# Patient Record
Sex: Female | Born: 1955 | Race: White | Hispanic: No | Smoking: Never smoker
Health system: Southern US, Community
[De-identification: ages and names within clinical notes are randomized; demographics above are authoritative.]

## PROBLEM LIST (undated history)

## (undated) DIAGNOSIS — K219 Gastro-esophageal reflux disease without esophagitis: Secondary | ICD-10-CM

## (undated) DIAGNOSIS — E7800 Pure hypercholesterolemia, unspecified: Secondary | ICD-10-CM

## (undated) DIAGNOSIS — M1990 Unspecified osteoarthritis, unspecified site: Secondary | ICD-10-CM

## (undated) DIAGNOSIS — F32A Depression, unspecified: Secondary | ICD-10-CM

## (undated) DIAGNOSIS — E119 Type 2 diabetes mellitus without complications: Secondary | ICD-10-CM

## (undated) DIAGNOSIS — T7840XA Allergy, unspecified, initial encounter: Secondary | ICD-10-CM

## (undated) HISTORY — DX: Unspecified osteoarthritis, unspecified site: M19.90

## (undated) HISTORY — DX: Gastro-esophageal reflux disease without esophagitis: K21.9

## (undated) HISTORY — DX: Depression, unspecified: F32.A

## (undated) HISTORY — PX: TUBAL LIGATION: SHX77

## (undated) HISTORY — PX: APPENDECTOMY: SHX54

## (undated) HISTORY — DX: Allergy, unspecified, initial encounter: T78.40XA

---

## 2021-02-04 LAB — HM COLONOSCOPY

## 2021-03-17 LAB — HM MAMMOGRAPHY

## 2021-09-25 ENCOUNTER — Ambulatory Visit (HOSPITAL_COMMUNITY)
Admission: EM | Admit: 2021-09-25 | Discharge: 2021-09-25 | Disposition: A | Discharge status: Home / Self Care | Payer: No Typology Code available for payment source | Attending: Emergency Medicine | Admitting: Emergency Medicine

## 2021-09-25 ENCOUNTER — Encounter (HOSPITAL_COMMUNITY): Payer: Self-pay

## 2021-09-25 DIAGNOSIS — R059 Cough, unspecified: Secondary | ICD-10-CM | POA: Diagnosis not present

## 2021-09-25 DIAGNOSIS — Z20822 Contact with and (suspected) exposure to covid-19: Secondary | ICD-10-CM | POA: Diagnosis not present

## 2021-09-25 DIAGNOSIS — J069 Acute upper respiratory infection, unspecified: Secondary | ICD-10-CM | POA: Insufficient documentation

## 2021-09-25 HISTORY — DX: Type 2 diabetes mellitus without complications: E11.9

## 2021-09-25 HISTORY — DX: Pure hypercholesterolemia, unspecified: E78.00

## 2021-09-25 MED ORDER — PREDNISONE 20 MG PO TABS: 40.0000 mg | ORAL_TABLET | Freq: Every day | ORAL | 0 refills | Status: DC

## 2021-09-25 MED ORDER — AZITHROMYCIN 250 MG PO TABS: 250.0000 mg | ORAL_TABLET | Freq: Every day | ORAL | 0 refills | Status: DC

## 2021-09-25 NOTE — ED Provider Notes (Addendum)
[place]    CSN: [id number] Arrival date & time: 09/25/21  1136      History   Chief Complaint Chief Complaint  Patient presents with   Sore Throat   Cough   Nasal Congestion    HPI Emily Sims is a 66 y.o. female.   Patient presents with congestion, rhinorrhea, sore throat and a intermittent productive cough for 4 days.  Cough has been constant and begun to interfere with sleep.  Has attempted use of Mucinex DM and Sudafed.  Tolerating food and liquids.  No known sick contacts. Denies shortness of breath or wheezing, fever, chills or body aches, ear pain.  Endorses a history of reoccurring bronchitis at least yearly, diabetes mellitus and hypercholesterolemia.  Home COVID test negative   Past Medical History:  Diagnosis Date   Diabetes mellitus without complication ([place])    Hypercholesteremia     There are no problems to display for this patient.   History reviewed. No pertinent surgical history.  OB History   No obstetric history on file.      Home Medications    Prior to Admission medications   Medication Sig Start Date End Date Taking? Authorizing Provider  atorvastatin (LIPITOR) 20 MG tablet TAKE 1 TABLET (20 MG TOTAL) BY MOUTH DAILY. DEFAULT OP INS 03/13/16  Yes [provider]  empagliflozin (JARDIANCE) 10 MG TABS tablet  04/18/21  Yes [provider]  FLUoxetine (PROZAC) 40 MG capsule TAKE 1 CAPSULE BY MOUTH EVERY DAY FOR 30 DAYS 05/18/21  Yes [provider]  omeprazole (PRILOSEC) 40 MG capsule Take 1 tablet by mouth daily. 05/14/21  Yes [provider]    Family History History reviewed. No pertinent family history.  Social History Social History   Tobacco Use   Smoking status: Never    Passive exposure: Never   Smokeless tobacco: Never  Vaping Use   Vaping Use: Never used  Substance Use Topics   Alcohol use: Never   Drug use: Never     Allergies   Patient has no known  allergies.   Review of Systems Review of Systems  Constitutional: Negative.   HENT:  Positive for congestion, rhinorrhea and sore throat. Negative for dental problem, drooling, ear discharge, ear pain, facial swelling, hearing loss, mouth sores, nosebleeds, postnasal drip, sinus pressure, sinus pain, sneezing, tinnitus, trouble swallowing and voice change.   Respiratory:  Positive for cough. Negative for apnea, choking, chest tightness, shortness of breath, wheezing and stridor.   Cardiovascular: Negative.   Gastrointestinal: Negative.   Skin: Negative.   Neurological: Negative.      Physical Exam Triage Vital Signs ED Triage Vitals [09/25/21 1210]  Enc Vitals Group     BP      Pulse      Resp      Temp      Temp src      SpO2      Weight 185 lb (83.9 kg)     Height '5\' 4"'$  (1.626 m)     Head Circumference      Peak Flow      Pain Score 0     Pain Loc      Pain Edu?      Excl. in [place]?    No data found.  Updated Vital Signs Ht '5\' 4"'$  (1.626 m)   Wt 185 lb (83.9 kg)   LMP  (LMP Unknown)   BMI 31.76 kg/m   Visual Acuity Right Eye Distance:  Left Eye Distance:   Bilateral Distance:    Right Eye Near:   Left Eye Near:    Bilateral Near:     Physical Exam Constitutional:      Appearance: Normal appearance. She is well-developed.  HENT:     Head: Normocephalic.     Right Ear: Tympanic membrane and ear canal normal.     Left Ear: Tympanic membrane and ear canal normal.     Nose: Congestion and rhinorrhea present.     Mouth/Throat:     Mouth: Mucous membranes are moist.     Pharynx: Oropharynx is clear.     Tonsils: No tonsillar exudate. 0 on the right. 0 on the left.  Cardiovascular:     Rate and Rhythm: Normal rate and regular rhythm.     Heart sounds: Normal heart sounds.  Pulmonary:     Effort: Pulmonary effort is normal.     Breath sounds: Normal breath sounds.  Musculoskeletal:     Cervical back: Normal range of motion and neck supple.  Skin:     General: Skin is warm and dry.  Neurological:     General: No focal deficit present.     Mental Status: She is alert and oriented to person, place, and time.  Psychiatric:        Mood and Affect: Mood normal.        Behavior: Behavior normal.      UC Treatments / Results  Labs (all labs ordered are listed, but only abnormal results are displayed) Labs Reviewed - No data to display  EKG   Radiology No results found.  Procedures Procedures (including critical care time)  Medications Ordered in UC Medications - No data to display  Initial Impression / Assessment and Plan / UC Course  I have reviewed the triage vital signs and the nursing notes.  Pertinent labs & imaging results that were available during my care of the patient were reviewed by me and considered in my medical decision making (see chart for details).  Viral URI with cough  Vitals are stable patient is in no signs of distress, lungs are clear to auscultation and O2 saturation via machine on room is greater than 90 without assistance, low suspicion for bronchitis, pneumonia or pneumothorax and therefore will defer imaging, retesting for COVID completed, will qualify for antivirals if positive, discussed use and administration as well as quarantine if positive , etiology is most likely viral, discussed with patient, prescribed prednisone, offered albuterol inhaler and cough medicine however patient endorses that Tessalon is ineffective and she does not tolerate inhalers or liquid cough medicine well, recommended over-the-counter medication she can take for additional support, watchful wait antibiotic placed at pharmacy for day 10 of illness if symptoms have not begun to improve, may follow-up with urgent care as needed Final Clinical Impressions(s) / UC Diagnoses   Final diagnoses:  None   Discharge Instructions   None    ED Prescriptions   None    PDMP not reviewed this encounter.   Hans Eden,  NP 09/25/21 1251    Hans Eden, NP 09/25/21 1252

## 2021-09-25 NOTE — Discharge Instructions (Signed)
We will contact you if your COVID test is positive.  Please quarantine while you wait for the results.  If your test is negative you may resume normal activities.  If your test is positive please continue to quarantine for at least 5 days from your symptom onset , your quarantine will and on Tuesday, if symptoms are still present after 5 days please wear masks while completing activities.  If positive you will qualify for antiviral medicine which will help minimize your symptoms, take every morning and every evening for 5 days, medicine will be sent in at time of notification  Take prednisone every morning with food for 5 days to reduce inflammation and irritation to the upper airways which ideally will help minimize your coughing, at this time your lungs are clear and you are getting enough air without assistance and I do not believe that you have bronchitis  If your symptoms persist to 10 days without signs of resolution a Z-Pak will be at the pharmacy waiting for you on Friday    You can take Tylenol and/or Ibuprofen as needed for fever reduction and pain relief.   For cough: honey 1/2 to 1 teaspoon (you can dilute the honey in water or another fluid).  You can also use guaifenesin and dextromethorphan for cough. You can use a humidifier for chest congestion and cough.  If you don't have a humidifier, you can sit in the bathroom with the hot shower running.      For sore throat: try warm salt water gargles, cepacol lozenges, throat spray, warm tea or water with lemon/honey, popsicles or ice, or OTC cold relief medicine for throat discomfort.   For congestion: take a daily anti-histamine like Zyrtec, Claritin, and a oral decongestant, such as pseudoephedrine.  You can also use Flonase 1-2 sprays in each nostril daily.   It is important to stay hydrated: drink plenty of fluids (water, gatorade/powerade/pedialyte, juices, or teas) to keep your throat moisturized and help further relieve  irritation/discomfort.

## 2021-09-25 NOTE — ED Triage Notes (Signed)
Pt is here for sore throat, cough  runny nose , pain across middle  of back , pt states cough is keeping her up at night  x4days . Pt denies pain when swallowing. Pt states she took a home covid test and it was negative.

## 2021-09-26 LAB — SARS CORONAVIRUS 2 (TAT 6-24 HRS): SARS Coronavirus 2: NEGATIVE

## 2021-09-29 ENCOUNTER — Ambulatory Visit (INDEPENDENT_AMBULATORY_CARE_PROVIDER_SITE_OTHER): Payer: PRIVATE HEALTH INSURANCE | Admitting: Family

## 2021-09-29 ENCOUNTER — Encounter: Payer: Self-pay | Admitting: Family

## 2021-09-29 VITALS — BP 98/62 | HR 72 | Temp 97.4°F | Ht 64.0 in | Wt 191.4 lb

## 2021-09-29 DIAGNOSIS — E1122 Type 2 diabetes mellitus with diabetic chronic kidney disease: Secondary | ICD-10-CM

## 2021-09-29 DIAGNOSIS — E1169 Type 2 diabetes mellitus with other specified complication: Secondary | ICD-10-CM | POA: Insufficient documentation

## 2021-09-29 DIAGNOSIS — E1165 Type 2 diabetes mellitus with hyperglycemia: Secondary | ICD-10-CM | POA: Insufficient documentation

## 2021-09-29 DIAGNOSIS — K219 Gastro-esophageal reflux disease without esophagitis: Secondary | ICD-10-CM | POA: Insufficient documentation

## 2021-09-29 DIAGNOSIS — F331 Major depressive disorder, recurrent, moderate: Secondary | ICD-10-CM | POA: Diagnosis not present

## 2021-09-29 MED ORDER — GLIPIZIDE ER 2.5 MG PO TB24: 2.5000 mg | ORAL_TABLET | Freq: Every day | ORAL | 2 refills | Status: DC

## 2021-09-29 MED ORDER — FLUOXETINE HCL 40 MG PO CAPS: 40.0000 mg | ORAL_CAPSULE | Freq: Every day | ORAL | 1 refills | Status: DC

## 2021-09-29 NOTE — Assessment & Plan Note (Signed)
   chronic  taking Lipitor qd  no refill needed today, last labs 05/2021  f/u in 3 mos fasting

## 2021-09-29 NOTE — Assessment & Plan Note (Addendum)
   chronic  reports intolerance with Metformin, only taking Farxiga '10mg'$  qd, but is still expensive, she is working w/her ins to see affordable choices, advised to check w/pharmaceutical co. directly re: pt assistance  no refill needed today  f/u in 3 mos w/labs

## 2021-09-29 NOTE — Assessment & Plan Note (Signed)
   chronic  stable with Prozac '40mg'$  qd  sending refill today  f/u in 6 mos or prn

## 2021-09-29 NOTE — Patient Instructions (Signed)
Welcome to [facility], It was a pleasure meeting you today!    As discussed, I have sent your refills to your pharmacy.   Please schedule a 3 month follow up visit today or online via MyChart.    PLEASE NOTE: If you had any LAB tests please let us know if you have not heard back within a few days. You may see your results on MyChart before we have a chance to review them but we will give you a call once they are reviewed by [place]. If we ordered any REFERRALS today, please let us know if you have not heard from their office within the next week.  Let us know through MyChart if you are needing REFILLS, or have your pharmacy send [place] the request. You can also use MyChart to communicate with me or any office staff.   Please try these tips to maintain a healthy lifestyle:  Eat most of your calories during the day when you are active. Eliminate processed foods including packaged sweets (pies, cakes, cookies), reduce intake of potatoes, white bread, white pasta, and white rice. Look for whole grain options, oat flour or almond flour.  Each meal should contain half fruits/vegetables, one quarter protein, and one quarter carbs (no bigger than a computer mouse).  Cut down on sweet beverages. This includes juice, soda, and sweet tea. Also watch fruit intake, though this is a healthier sweet option, it still contains natural sugar! Limit to 3 servings daily.  Drink at least 1 8oz. glass of water with each meal and aim for at least 8 glasses per day  Exercise at least 150 minutes every week.

## 2021-09-29 NOTE — Progress Notes (Signed)
New Patient Office Visit  Subjective:  Patient ID: Emily Sims, female    DOB: Apr 07, 1955  Age: 66 y.o. MRN: [id number]  CC:  Chief Complaint  Patient presents with   New Patient (Initial Visit)    HPI Emily Sims presents for establishing care today.  Depression:  pt presents with several year history of depression. She has been taking Prozac for long time with good results, denies any SE. Requesting refill today. T2DM: Pt is currently maintained on the following medications for diabetes: Farxiga Failed meds include: Metformin; GLP-1, never tried, too expensive, no other meds. Denies polyuria/polydipsia/polyphagia Denies hypoglycemia Home glucose readings range:  fasting 140s; does not check daily Last A1C was 7.1 in 05/2021.  Assessment & Plan:   Problem List Items Addressed This Visit       Endocrine   Type 2 diabetes mellitus with chronic kidney disease, without long-term current use of insulin ([place]) - Primary    chronic reports intolerance with Metformin, only taking Farxiga '10mg'$  qd, but is still expensive, she is working w/her ins to see affordable choices, advised to check w/pharmaceutical co. directly re: pt assistance no refill needed today f/u in 3 mos w/labs      Relevant Medications   glipiZIDE (GLUCOTROL XL) 2.5 MG 24 hr tablet     Other   Moderate recurrent major depression ([place])    chronic stable with Prozac '40mg'$  qd sending refill today f/u in 6 mos or prn      Relevant Medications   FLUoxetine (PROZAC) 40 MG capsule    Subjective:    Outpatient Medications Prior to Visit  Medication Sig Dispense Refill   atorvastatin (LIPITOR) 20 MG tablet TAKE 1 TABLET (20 MG TOTAL) BY MOUTH DAILY. DEFAULT OP INS     [START ON 09/30/2021] azithromycin (ZITHROMAX) 250 MG tablet Take 1 tablet (250 mg total) by mouth daily. Take first 2 tablets together, then 1 every day until finished. 6 tablet 0   empagliflozin (JARDIANCE) 10 MG TABS tablet       omeprazole (PRILOSEC) 40 MG capsule Take 1 tablet by mouth daily.     predniSONE (DELTASONE) 20 MG tablet Take 2 tablets (40 mg total) by mouth daily. 10 tablet 0   FLUoxetine (PROZAC) 40 MG capsule TAKE 1 CAPSULE BY MOUTH EVERY DAY FOR 30 DAYS     No facility-administered medications prior to visit.   Past Medical History:  Diagnosis Date   Allergy    Arthritis    Depression    Diabetes mellitus without complication (H[facility])    GERD (gastroesophageal reflux disease)    Hypercholesteremia    History reviewed. No pertinent surgical history.  Objective:   Today's Vitals: BP 98/62   Pulse 72   Temp (!) 97.4 F (36.3 C)   Ht '5\' 4"'$  (1.626 m)   Wt 191 lb 6.4 oz (86.8 kg)   LMP  (LMP Unknown)   SpO2 96%   BMI 32.85 kg/m   Physical Exam Vitals and nursing note reviewed.  Constitutional:      Appearance: Normal appearance. She is obese.  Cardiovascular:     Rate and Rhythm: Normal rate and regular rhythm.  Pulmonary:     Effort: Pulmonary effort is normal.     Breath sounds: Normal breath sounds.  Musculoskeletal:        General: Normal range of motion.  Skin:    General: Skin is warm and dry.  Neurological:     Mental Status: She  is alert.  Psychiatric:        Mood and Affect: Mood normal.        Behavior: Behavior normal.     Meds ordered this encounter  Medications   FLUoxetine (PROZAC) 40 MG capsule    Sig: Take 1 capsule (40 mg total) by mouth daily.    Dispense:  90 capsule    Refill:  1    Order Specific Question:   Supervising Provider    Answer:   ANDY, CAMILLE L [[id number]]   glipiZIDE (GLUCOTROL XL) 2.5 MG 24 hr tablet    Sig: Take 1 tablet (2.5 mg total) by mouth daily with breakfast.    Dispense:  30 tablet    Refill:  2    Order Specific Question:   Supervising Provider    Answer:   ANDY, CAMILLE L [[id number]]    Jeanie Sewer, NP

## 2021-11-08 ENCOUNTER — Other Ambulatory Visit: Payer: Self-pay

## 2021-11-08 ENCOUNTER — Telehealth: Payer: Self-pay | Admitting: Family

## 2021-11-08 MED ORDER — ATORVASTATIN CALCIUM 20 MG PO TABS: ORAL_TABLET | ORAL | 0 refills | Status: DC

## 2021-11-08 MED ORDER — EMPAGLIFLOZIN 10 MG PO TABS: 10.0000 mg | ORAL_TABLET | Freq: Every day | ORAL | 0 refills | Status: DC

## 2021-11-08 NOTE — Telephone Encounter (Signed)
RX sent

## 2021-11-08 NOTE — Telephone Encounter (Signed)
Patient states: -She just established care with [facility] on 09/29/21 but forgot that meds she needs refilled were through her former pcp   LAST APPOINTMENT DATE:  09/29/21  NEXT APPOINTMENT DATE: 12/27/21  MEDICATION: atorvastatin (LIPITOR) 20 MG tablet  empagliflozin (JARDIANCE) 10 MG TABS tablet   Is the patient out of medication? Will be out the week of 10/23  PHARMACY: [facility] [address], [place],  [address]

## 2021-12-13 DIAGNOSIS — M5432 Sciatica, left side: Secondary | ICD-10-CM | POA: Diagnosis not present

## 2021-12-13 DIAGNOSIS — M9903 Segmental and somatic dysfunction of lumbar region: Secondary | ICD-10-CM | POA: Diagnosis not present

## 2021-12-14 ENCOUNTER — Telehealth: Payer: Self-pay | Admitting: Family

## 2021-12-14 ENCOUNTER — Other Ambulatory Visit: Payer: Self-pay

## 2021-12-14 DIAGNOSIS — M5432 Sciatica, left side: Secondary | ICD-10-CM | POA: Diagnosis not present

## 2021-12-14 DIAGNOSIS — M9903 Segmental and somatic dysfunction of lumbar region: Secondary | ICD-10-CM | POA: Diagnosis not present

## 2021-12-14 MED ORDER — OMEPRAZOLE 40 MG PO CPDR: 40.0000 mg | DELAYED_RELEASE_CAPSULE | Freq: Every day | ORAL | 0 refills | Status: DC

## 2021-12-14 NOTE — Telephone Encounter (Signed)
RX Sent. 

## 2021-12-14 NOTE — Telephone Encounter (Signed)
  LAST APPOINTMENT DATE: 09/29/21  NEXT APPOINTMENT DATE: 12/27/21  MEDICATION:  omeprazole (PRILOSEC) 40 MG capsule   Is the patient out of medication? Will be out in 2-3 days  PHARMACY: [id number] [place], [place] - [address] [place]  [id number]: [phone] Fax: [phone]   Patient states: -Medication was last filled [facility]

## 2021-12-20 DIAGNOSIS — M9903 Segmental and somatic dysfunction of lumbar region: Secondary | ICD-10-CM | POA: Diagnosis not present

## 2021-12-20 DIAGNOSIS — M5432 Sciatica, left side: Secondary | ICD-10-CM | POA: Diagnosis not present

## 2021-12-21 ENCOUNTER — Other Ambulatory Visit: Payer: Self-pay | Admitting: Family

## 2021-12-21 DIAGNOSIS — E1122 Type 2 diabetes mellitus with diabetic chronic kidney disease: Secondary | ICD-10-CM

## 2021-12-22 DIAGNOSIS — M9903 Segmental and somatic dysfunction of lumbar region: Secondary | ICD-10-CM | POA: Diagnosis not present

## 2021-12-22 DIAGNOSIS — M5432 Sciatica, left side: Secondary | ICD-10-CM | POA: Diagnosis not present

## 2021-12-26 DIAGNOSIS — M5432 Sciatica, left side: Secondary | ICD-10-CM | POA: Diagnosis not present

## 2021-12-26 DIAGNOSIS — M9903 Segmental and somatic dysfunction of lumbar region: Secondary | ICD-10-CM | POA: Diagnosis not present

## 2021-12-27 ENCOUNTER — Encounter: Payer: Self-pay | Admitting: Family

## 2021-12-27 ENCOUNTER — Ambulatory Visit (INDEPENDENT_AMBULATORY_CARE_PROVIDER_SITE_OTHER): Payer: No Typology Code available for payment source | Admitting: Family

## 2021-12-27 VITALS — BP 123/81 | HR 66 | Temp 96.9°F | Ht 64.0 in | Wt 194.4 lb

## 2021-12-27 DIAGNOSIS — E1122 Type 2 diabetes mellitus with diabetic chronic kidney disease: Secondary | ICD-10-CM

## 2021-12-27 DIAGNOSIS — E1169 Type 2 diabetes mellitus with other specified complication: Secondary | ICD-10-CM | POA: Diagnosis not present

## 2021-12-27 DIAGNOSIS — E785 Hyperlipidemia, unspecified: Secondary | ICD-10-CM

## 2021-12-27 DIAGNOSIS — D229 Melanocytic nevi, unspecified: Secondary | ICD-10-CM

## 2021-12-27 LAB — COMPREHENSIVE METABOLIC PANEL
ALT: 16 U/L (ref 0–35)
AST: 12 U/L (ref 0–37)
Albumin: 4.2 g/dL (ref 3.5–5.2)
Alkaline Phosphatase: 81 U/L (ref 39–117)
BUN: 20 mg/dL (ref 6–23)
CO2: 29 mEq/L (ref 19–32)
Calcium: 9.1 mg/dL (ref 8.4–10.5)
Chloride: 102 mEq/L (ref 96–112)
Creatinine, Ser: 0.79 mg/dL (ref 0.40–1.20)
GFR: 77.53 mL/min (ref >60.00)
Glucose, Bld: 123 mg/dL — ABNORMAL HIGH (ref 70–99)
Potassium: 4.1 mEq/L (ref 3.5–5.1)
Sodium: 139 mEq/L (ref 135–145)
Total Bilirubin: 0.7 mg/dL (ref 0.2–1.2)
Total Protein: 6.8 g/dL (ref 6.0–8.3)

## 2021-12-27 LAB — LIPID PANEL
Cholesterol: 165 mg/dL (ref 0–200)
HDL: 56.2 mg/dL (ref >39.00)
NonHDL: 108.6
Total CHOL/HDL Ratio: 3
Triglycerides: 213 mg/dL — ABNORMAL HIGH (ref 0.0–149.0)
VLDL: 42.6 mg/dL — ABNORMAL HIGH (ref 0.0–40.0)

## 2021-12-27 LAB — LDL CHOLESTEROL, DIRECT: Direct LDL: 78 mg/dL

## 2021-12-27 LAB — HEMOGLOBIN A1C: Hgb A1c MFr Bld: 7.4 % — ABNORMAL HIGH (ref 4.6–6.5)

## 2021-12-27 MED ORDER — EMPAGLIFLOZIN 25 MG PO TABS: 25.0000 mg | ORAL_TABLET | Freq: Every day | ORAL | 1 refills | Status: DC

## 2021-12-27 NOTE — Patient Instructions (Signed)
It was very nice to see you today!   I will review your lab results via MyChart in a few days.   I have sent refills to your pharmacy and a referral to Dermatology.     PLEASE NOTE:  If you had any lab tests please let us know if you have not heard back within a few days. You may see your results on MyChart before we have a chance to review them but we will give you a call once they are reviewed by [place]. If we ordered any referrals today, please let us know if you have not heard from their office within the next week.

## 2021-12-27 NOTE — Assessment & Plan Note (Addendum)
chronic taking Farxiga '10mg'$  qd & Glipizide 2.'5mg'$  qd (this started last visit) checking labs today sending refill of [facility] today f/u in 6 mos

## 2021-12-27 NOTE — Assessment & Plan Note (Addendum)
chronic taking Lipitor '20mg'$  qd no refill needed today, checking labs f/u in 6 mos

## 2021-12-27 NOTE — Progress Notes (Signed)
Patient ID: Emily Sims, female    DOB: February 15, 1955, 66 y.o.   MRN: [id number]  Chief Complaint  Patient presents with   Diabetes    Fasting w/ Labs   Hyperlipidemia    HPI: T2DM: Pt is currently maintained on the following medications for diabetes: Farxiga, Glipizide Failed meds include: Metformin; GLP-1, never tried, too expensive, no other meds. Denies polyuria/polydipsia/polyphagia Denies hypoglycemia Home glucose readings range:  does not check daily Last A1C was 7.1 in 05/2021.  Rechecking today.  Assessment & Plan:   Problem List Items Addressed This Visit       Endocrine   Type 2 diabetes mellitus with chronic kidney disease, without long-term current use of insulin ([place]) - Primary    chronic taking Farxiga 46m qd & Glipizide 2.573mqd (this started last visit) checking labs today sending refill of Farxiga today f/u in 6 mos       Relevant Medications   empagliflozin (JARDIANCE) 25 MG TABS tablet   Other Relevant Orders   HM DIABETES FOOT EXAM (Completed)   Comp Met ([facility])   HgB A1c   Hyperlipidemia associated with type 2 diabetes mellitus (H[facility])    chronic taking Lipitor 2033md no refill needed today, checking labs f/u in 6 mos       Relevant Medications   empagliflozin (JARDIANCE) 25 MG TABS tablet   Other Relevant Orders   Lipid panel   Other Visit Diagnoses     Atypical mole       Relevant Orders   Ambulatory referral to Dermatology       Subjective:    Outpatient Medications Prior to Visit  Medication Sig Dispense Refill   atorvastatin (LIPITOR) 20 MG tablet TAKE 1 TABLET (20 MG TOTAL) BY MOUTH DAILY. DEFAULT OP INS 90 tablet 0   FLUoxetine (PROZAC) 40 MG capsule Take 1 capsule (40 mg total) by mouth daily. 90 capsule 1   glipiZIDE (GLUCOTROL XL) 2.5 MG 24 hr tablet TAKE 1 TABLET BY MOUTH DAILY WITH BREAKFAST. 90 tablet 0   omeprazole (PRILOSEC) 40 MG capsule Take 1 capsule (40 mg total) by mouth daily. 90 capsule 0   empagliflozin  (JARDIANCE) 10 MG TABS tablet Take 1 tablet (10 mg total) by mouth daily. (Patient taking differently: Take 25 mg by mouth daily.) 90 tablet 0   predniSONE (DELTASONE) 20 MG tablet Take 2 tablets (40 mg total) by mouth daily. 10 tablet 0   azithromycin (ZITHROMAX) 250 MG tablet Take 1 tablet (250 mg total) by mouth daily. Take first 2 tablets together, then 1 every day until finished. (Patient not taking: Reported on 12/27/2021) 6 tablet 0   No facility-administered medications prior to visit.   Past Medical History:  Diagnosis Date   Allergy    Arthritis    Depression    Diabetes mellitus without complication (HCC)    GERD (gastroesophageal reflux disease)    Hypercholesteremia    History reviewed. No pertinent surgical history. No Known Allergies    Objective:    Physical Exam Vitals and nursing note reviewed.  Constitutional:      Appearance: Normal appearance.  HENT:     Head:   Cardiovascular:     Rate and Rhythm: Normal rate and regular rhythm.     Pulses:          Dorsalis pedis pulses are 2+ on the right side and 2+ on the left side.       Posterior tibial pulses are 2+ on  the right side and 2+ on the left side.  Pulmonary:     Effort: Pulmonary effort is normal.     Breath sounds: Normal breath sounds.  Musculoskeletal:        General: Normal range of motion.  Feet:     Right foot:     Protective Sensation: 10 sites tested.  9 sites sensed.     Skin integrity: Callus (mild on heel) present.     Left foot:     Protective Sensation: 10 sites tested.  9 sites sensed.     Skin integrity: Callus (mild on heel) present.  Skin:    General: Skin is warm and dry.     Findings: Lesion (0.3cm diameter, slighly raised, tan on left side of face, & pinpoint raised, skin colored bump under left eye, see diagram above) present.  Neurological:     Mental Status: She is alert.  Psychiatric:        Mood and Affect: Mood normal.        Behavior: Behavior normal.    BP 123/81  (BP Location: Left Arm, Patient Position: Sitting, Cuff Size: Large)   Pulse 66   Temp (!) 96.9 F (36.1 C) (Temporal)   Ht _0  (1.626 m)   Wt 194 lb 6 oz (88.2 kg)   LMP  (LMP Unknown)   SpO2 97%   BMI 33.36 kg/m  Wt Readings from Last 3 Encounters:  12/27/21 194 lb 6 oz (88.2 kg)  09/29/21 191 lb 6.4 oz (86.8 kg)  09/25/21 185 lb (83.9 kg)      Jeanie Sewer, NP

## 2021-12-29 DIAGNOSIS — M5432 Sciatica, left side: Secondary | ICD-10-CM | POA: Diagnosis not present

## 2021-12-29 DIAGNOSIS — M9903 Segmental and somatic dysfunction of lumbar region: Secondary | ICD-10-CM | POA: Diagnosis not present

## 2021-12-29 MED ORDER — GLIPIZIDE ER 2.5 MG PO TB24: 2.5000 mg | ORAL_TABLET | Freq: Two times a day (BID) | ORAL | 1 refills | Status: DC

## 2021-12-29 NOTE — Progress Notes (Signed)
Your glucose is high at 123, electrolytes, liver & kidney function are all normal. Your A1C is up to 7.4. Remember your goal is to get closer to 6!  Increase your Glipizide to twice a day. I would like you to start checking your blood sugar at least in the am, fasting. Let me know if I need to send a glucometer to your pharmacy.  Your cholesterol numbers look good, just your triglycerides & bad cholesterol # (VLDL)  are a little high. Try to reduce any fried foods, alcohol, nonnutritional snacks e.g. chips/cookies,pies, cakes and candies, etc. Always try to increase vegetables & fiber in your diet. Continue or restart an exercise routine, shooting for 45mn 5-7days per week.   Let me know about the glucometer & if you have any questions!

## 2021-12-29 NOTE — Addendum Note (Signed)
Addended byJeanie Sewer on: 12/29/2021 07:20 AM   Modules accepted: Orders

## 2022-01-03 DIAGNOSIS — M9903 Segmental and somatic dysfunction of lumbar region: Secondary | ICD-10-CM | POA: Diagnosis not present

## 2022-01-03 DIAGNOSIS — M5432 Sciatica, left side: Secondary | ICD-10-CM | POA: Diagnosis not present

## 2022-01-05 DIAGNOSIS — M9903 Segmental and somatic dysfunction of lumbar region: Secondary | ICD-10-CM | POA: Diagnosis not present

## 2022-01-05 DIAGNOSIS — M5432 Sciatica, left side: Secondary | ICD-10-CM | POA: Diagnosis not present

## 2022-01-09 DIAGNOSIS — M5432 Sciatica, left side: Secondary | ICD-10-CM | POA: Diagnosis not present

## 2022-01-09 DIAGNOSIS — M9903 Segmental and somatic dysfunction of lumbar region: Secondary | ICD-10-CM | POA: Diagnosis not present

## 2022-01-30 DIAGNOSIS — M5432 Sciatica, left side: Secondary | ICD-10-CM | POA: Diagnosis not present

## 2022-01-30 DIAGNOSIS — M9903 Segmental and somatic dysfunction of lumbar region: Secondary | ICD-10-CM | POA: Diagnosis not present

## 2022-02-07 ENCOUNTER — Other Ambulatory Visit: Payer: Self-pay | Admitting: Family

## 2022-02-15 ENCOUNTER — Telehealth: Payer: Self-pay | Admitting: Family

## 2022-02-15 NOTE — Telephone Encounter (Signed)
..  Type of form received:patient assistant form   Additional comments:   Received by: patient drop off   Form should be Faxed to: [phone]  Form should be mailed to:  na  Is patient requesting call for pickup: no    Form placed:   Myrtie Cruise fodler  Attach charge sheet.  Yes  Individual made aware of 3-5 business day turn around (Y/N)?  Yes

## 2022-02-20 DIAGNOSIS — M5432 Sciatica, left side: Secondary | ICD-10-CM | POA: Diagnosis not present

## 2022-02-20 DIAGNOSIS — M9903 Segmental and somatic dysfunction of lumbar region: Secondary | ICD-10-CM | POA: Diagnosis not present

## 2022-03-08 DIAGNOSIS — M9903 Segmental and somatic dysfunction of lumbar region: Secondary | ICD-10-CM | POA: Diagnosis not present

## 2022-03-08 DIAGNOSIS — M5432 Sciatica, left side: Secondary | ICD-10-CM | POA: Diagnosis not present

## 2022-03-13 ENCOUNTER — Other Ambulatory Visit: Payer: Self-pay | Admitting: Family

## 2022-03-14 ENCOUNTER — Other Ambulatory Visit: Payer: Self-pay | Admitting: Family

## 2022-03-14 DIAGNOSIS — E1122 Type 2 diabetes mellitus with diabetic chronic kidney disease: Secondary | ICD-10-CM

## 2022-03-23 DIAGNOSIS — M9903 Segmental and somatic dysfunction of lumbar region: Secondary | ICD-10-CM | POA: Diagnosis not present

## 2022-03-23 DIAGNOSIS — M5432 Sciatica, left side: Secondary | ICD-10-CM | POA: Diagnosis not present

## 2022-03-24 ENCOUNTER — Other Ambulatory Visit: Payer: Self-pay | Admitting: Family

## 2022-03-24 DIAGNOSIS — F331 Major depressive disorder, recurrent, moderate: Secondary | ICD-10-CM

## 2022-04-06 DIAGNOSIS — M5432 Sciatica, left side: Secondary | ICD-10-CM | POA: Diagnosis not present

## 2022-04-06 DIAGNOSIS — M9903 Segmental and somatic dysfunction of lumbar region: Secondary | ICD-10-CM | POA: Diagnosis not present

## 2022-04-11 NOTE — Telephone Encounter (Signed)
Form faxed on 01/30 with confirmation- form re faxed today 3/19- copy given to patient.

## 2022-04-11 NOTE — Telephone Encounter (Signed)
Patient bringing in new form for Patient Assistance

## 2022-04-28 IMAGING — CR [HOSPITAL] ABD KUB ONLY
1 series · 2 of 2 positions shown · non-contrast
Comparison: None.

HISTORY/INDICATIONS: Vomiting without nausea.
TECHNIQUE: KUB, 2 images.

[Series 1: supine kub · 0.17mm/px · 2 of 2 slices shown]
[im 1/2]
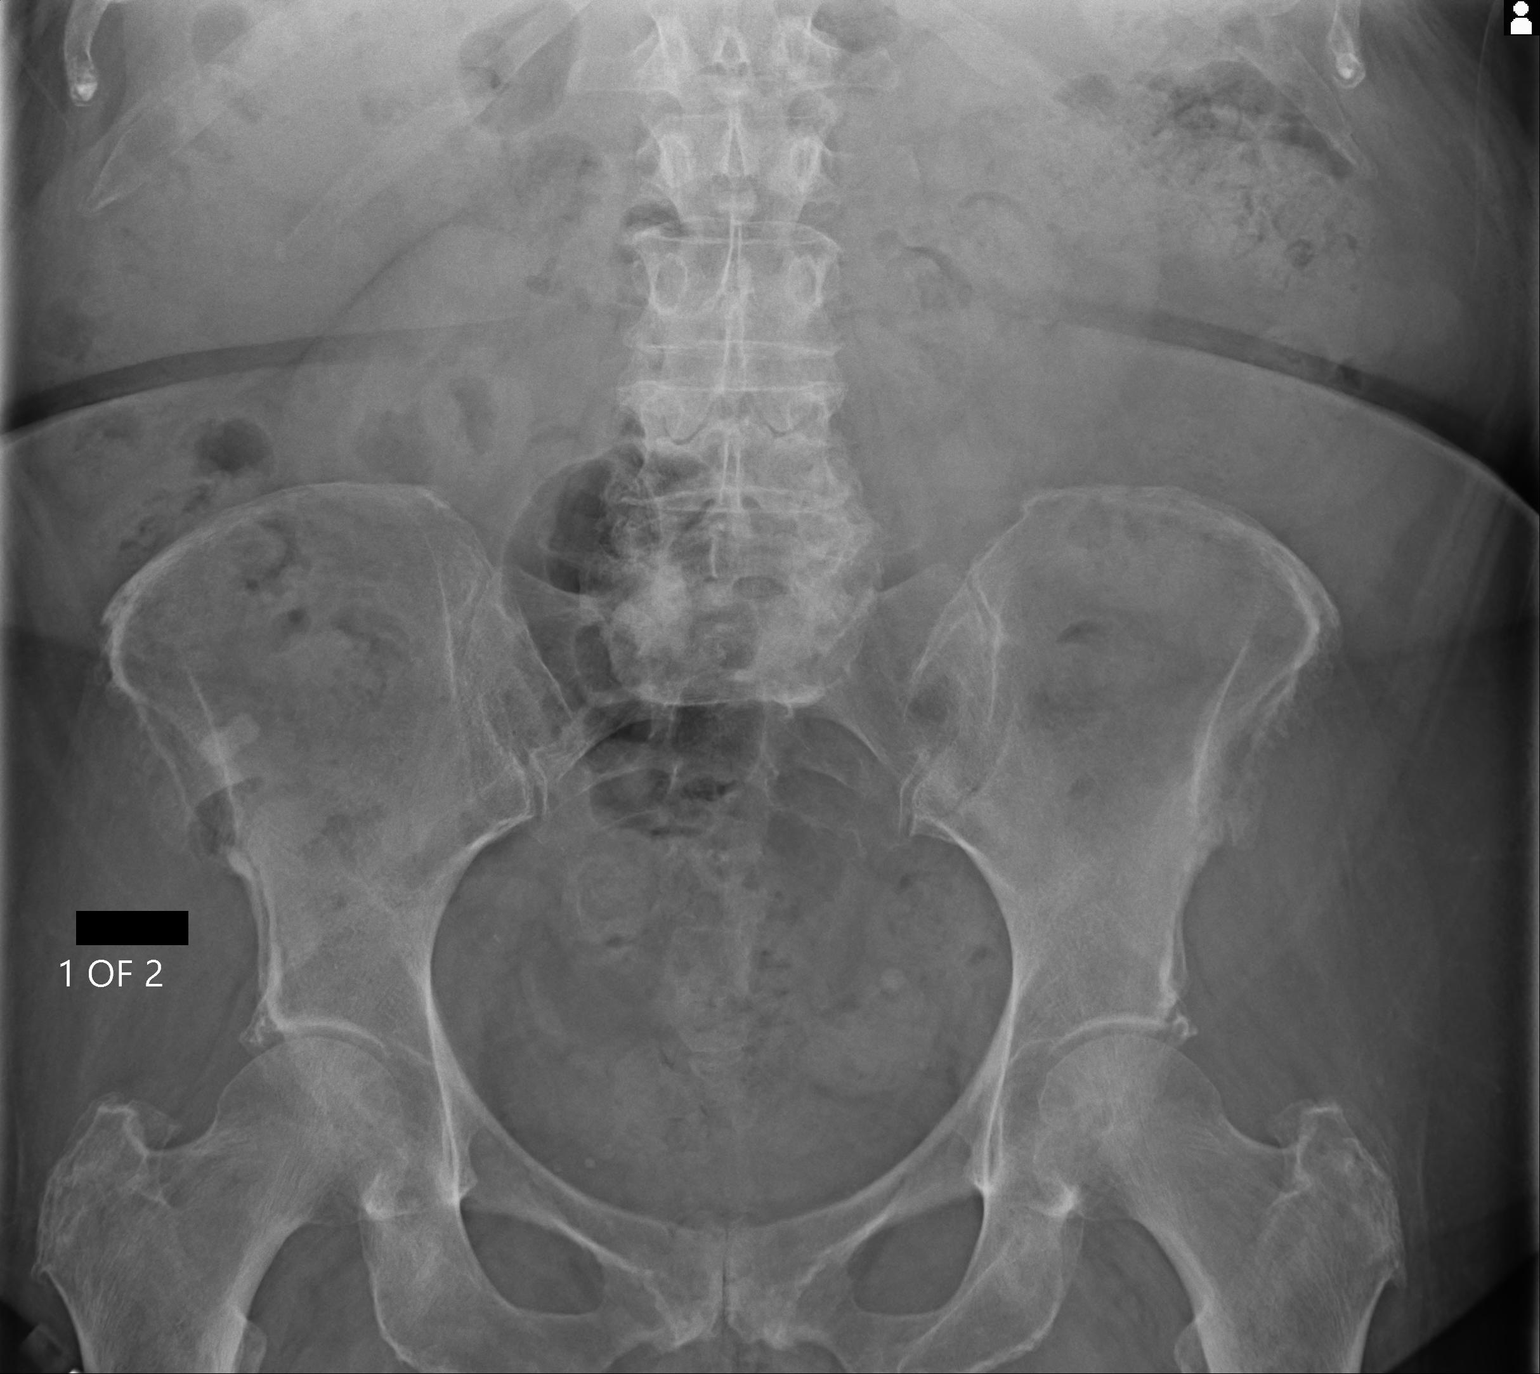
[im 2/2]
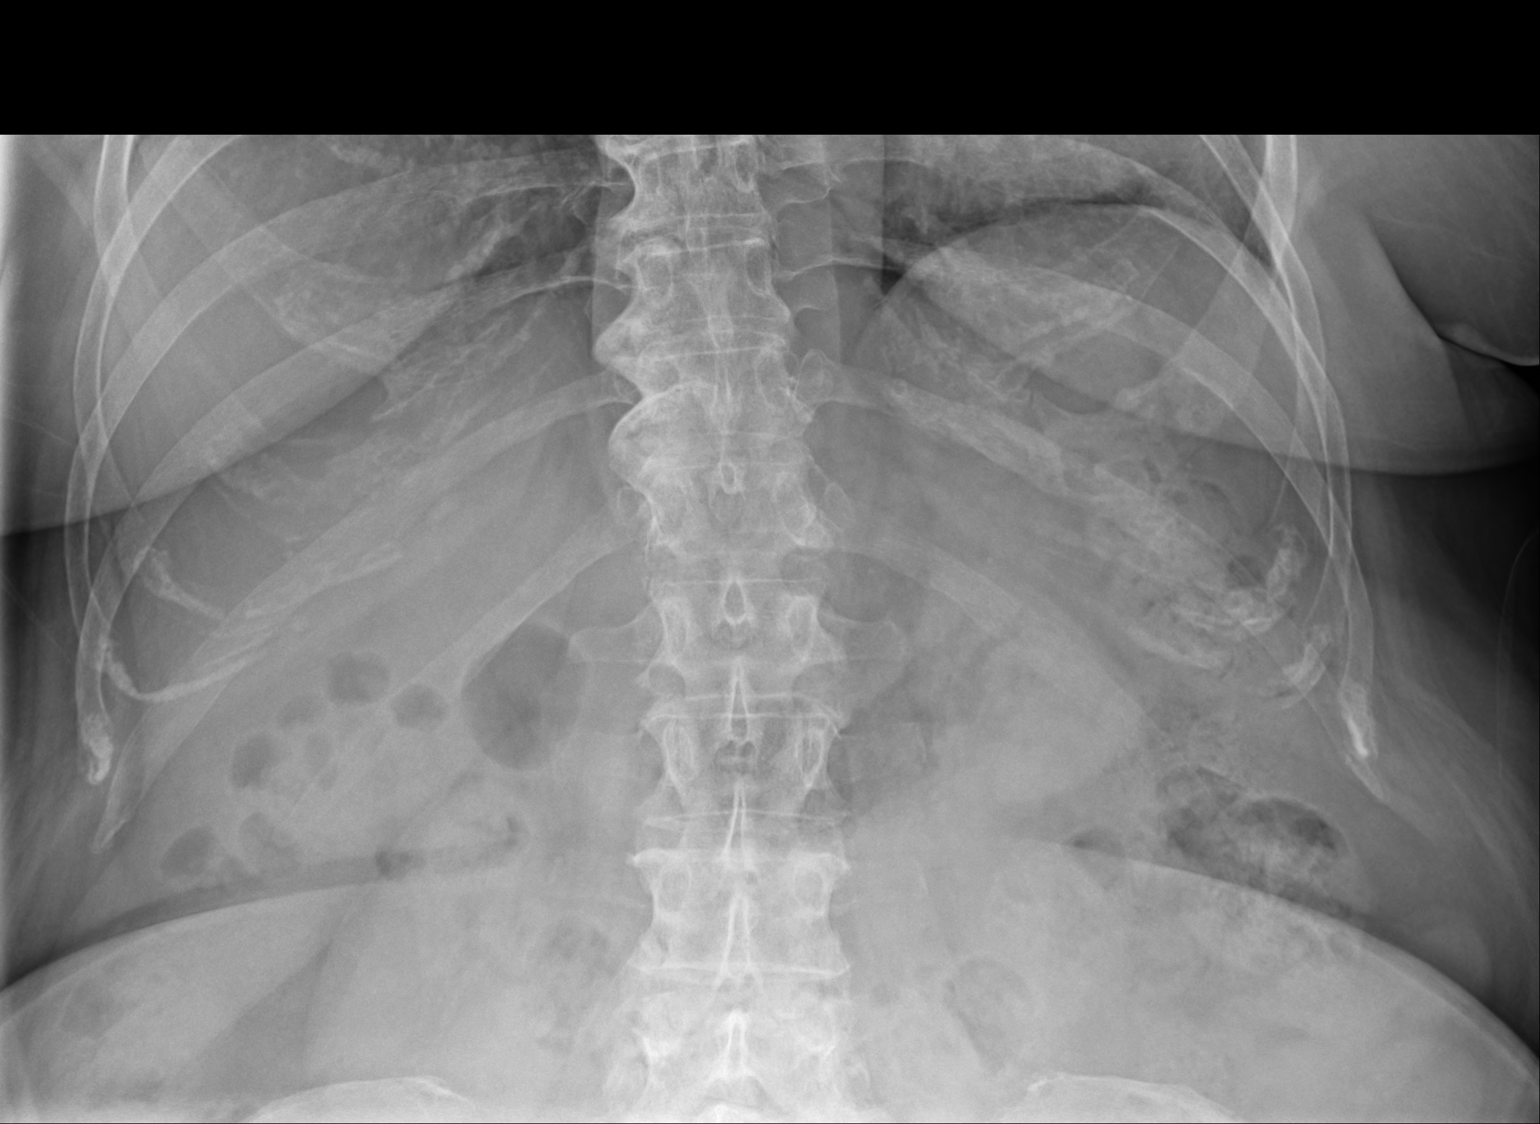

[2 of 2 positions shown; findings below may reference images not displayed]

FINDINGS: Mild to moderate stool retention throughout the colon and rectum, although nonobstructive gas pattern. No organomegaly nor abnormal calcifications. Severe symphysis degeneration, as well as lower lumbar facet arthropathy. Atherosclerotic calcifications are not present.
IMPRESSION: No acute disease on plain film.

## 2022-05-04 DIAGNOSIS — M9903 Segmental and somatic dysfunction of lumbar region: Secondary | ICD-10-CM | POA: Diagnosis not present

## 2022-05-04 DIAGNOSIS — M5432 Sciatica, left side: Secondary | ICD-10-CM | POA: Diagnosis not present

## 2022-05-05 ENCOUNTER — Other Ambulatory Visit: Payer: Self-pay | Admitting: Family

## 2022-05-19 ENCOUNTER — Other Ambulatory Visit: Payer: Self-pay | Admitting: Family

## 2022-05-19 DIAGNOSIS — E1122 Type 2 diabetes mellitus with diabetic chronic kidney disease: Secondary | ICD-10-CM

## 2022-06-13 ENCOUNTER — Other Ambulatory Visit: Payer: Self-pay

## 2022-06-13 DIAGNOSIS — E1122 Type 2 diabetes mellitus with diabetic chronic kidney disease: Secondary | ICD-10-CM

## 2022-06-13 MED ORDER — EMPAGLIFLOZIN 25 MG PO TABS: 25.0000 mg | ORAL_TABLET | Freq: Every day | ORAL | 1 refills | Status: DC

## 2022-06-15 ENCOUNTER — Other Ambulatory Visit: Payer: Self-pay | Admitting: Family

## 2022-06-22 DIAGNOSIS — M9903 Segmental and somatic dysfunction of lumbar region: Secondary | ICD-10-CM | POA: Diagnosis not present

## 2022-06-22 DIAGNOSIS — M5432 Sciatica, left side: Secondary | ICD-10-CM | POA: Diagnosis not present

## 2022-06-28 ENCOUNTER — Encounter: Payer: Self-pay | Admitting: Family

## 2022-06-28 ENCOUNTER — Ambulatory Visit (INDEPENDENT_AMBULATORY_CARE_PROVIDER_SITE_OTHER): Payer: No Typology Code available for payment source | Admitting: Family

## 2022-06-28 VITALS — BP 105/92 | HR 62 | Temp 96.9°F | Ht 64.0 in | Wt 194.5 lb

## 2022-06-28 DIAGNOSIS — E785 Hyperlipidemia, unspecified: Secondary | ICD-10-CM

## 2022-06-28 DIAGNOSIS — E1169 Type 2 diabetes mellitus with other specified complication: Secondary | ICD-10-CM | POA: Diagnosis not present

## 2022-06-28 DIAGNOSIS — Z7984 Long term (current) use of oral hypoglycemic drugs: Secondary | ICD-10-CM | POA: Diagnosis not present

## 2022-06-28 DIAGNOSIS — K219 Gastro-esophageal reflux disease without esophagitis: Secondary | ICD-10-CM

## 2022-06-28 DIAGNOSIS — Z1159 Encounter for screening for other viral diseases: Secondary | ICD-10-CM | POA: Diagnosis not present

## 2022-06-28 DIAGNOSIS — F331 Major depressive disorder, recurrent, moderate: Secondary | ICD-10-CM

## 2022-06-28 LAB — BASIC METABOLIC PANEL
BUN: 18 mg/dL (ref 6–23)
CO2: 24 mEq/L (ref 19–32)
Calcium: 9.1 mg/dL (ref 8.4–10.5)
Chloride: 103 mEq/L (ref 96–112)
Creatinine, Ser: 0.85 mg/dL (ref 0.40–1.20)
GFR: 70.76 mL/min (ref >60.00)
Glucose, Bld: 127 mg/dL — ABNORMAL HIGH (ref 70–99)
Potassium: 4.2 mEq/L (ref 3.5–5.1)
Sodium: 141 mEq/L (ref 135–145)

## 2022-06-28 LAB — HEMOGLOBIN A1C: Hgb A1c MFr Bld: 7.2 % — ABNORMAL HIGH (ref 4.6–6.5)

## 2022-06-28 MED ORDER — EMPAGLIFLOZIN 25 MG PO TABS: 25.0000 mg | ORAL_TABLET | Freq: Every day | ORAL | 1 refills | Status: DC

## 2022-06-28 MED ORDER — GLIPIZIDE ER 2.5 MG PO TB24
ORAL_TABLET | ORAL | 1 refills | Status: DC
Start: 2022-06-28 — End: 2022-07-02

## 2022-06-28 MED ORDER — OMEPRAZOLE 40 MG PO CPDR: 40.0000 mg | DELAYED_RELEASE_CAPSULE | Freq: Every day | ORAL | 1 refills | Status: DC

## 2022-06-28 MED ORDER — ATORVASTATIN CALCIUM 20 MG PO TABS: 20.0000 mg | ORAL_TABLET | Freq: Every day | ORAL | 1 refills | Status: DC

## 2022-06-28 NOTE — Assessment & Plan Note (Signed)
chronic stable with Prozac 40mg  qd refill sent today f/u in 6 mos or prn

## 2022-06-28 NOTE — Assessment & Plan Note (Signed)
chronic taking Lipitor 20mg  qd last lipids wnl - LDL 78, slightly high Trigs sending refill today f/u in 1 yr

## 2022-06-28 NOTE — Assessment & Plan Note (Addendum)
chronic reports fasting CBG around 130, no readings over 200 taking Farxiga 25mg  qd & Glipizide 2.5mg  bid rechecking labs today sending refill of Farxiga & Glipizide today f/u in 6 mos

## 2022-06-28 NOTE — Progress Notes (Signed)
Patient ID: Emily Sims, female    DOB: 03/26/1955, 67 y.o.   MRN: [id number]  Chief Complaint  Patient presents with   Diabetes    Follow up   Hyperlipidemia   Depression   HPI: GERD: Patient is presenting for follow up. This has been associated with abdominal bloating, fullness after meals, heartburn, and upper abdominal discomfort.  She denies chest pain. Symptoms have been present for  years. She denies dysphagia.  She has not lost weight. She denies melena, hematochezia, hematemesis, and coffee ground emesis. Medical therapy in the past has included proton pump inhibitors. Hyperlipidemia: Patient is currently maintained on the following medication for hyperlipidemia: Lipitor. Patient denies myalgia or other side effects. Patient reports good compliance with low fat/low cholesterol diet.  Last lipid panel as follows: Lab Results  Component Value Date   CHOL 165 12/27/2021   HDL 56.20 12/27/2021   LDLDIRECT 78.0 12/27/2021   TRIG 213.0 (H) 12/27/2021   CHOLHDL 3 12/27/2021   Depression:  pt presents with several year history of depression. She has been taking Prozac for long time with good results, denies any SE.  T2DM: Pt is currently maintained on the following medications for diabetes: Farxiga, Glipizide 2.5mg  bid Failed meds include: Metformin; GLP-1, never tried, too expensive, no other meds. Denies polyuria/polydipsia/polyphagia Denies hypoglycemia Home glucose readings range:  does not check daily Last A1C was 7.1 in 05/2021.  Rechecking today.  Assessment & Plan:  Type 2 diabetes mellitus with hyperlipidemia (HCC) Assessment & Plan: chronic reports fasting CBG around 130, no readings over 200 taking Farxiga 25mg  qd & Glipizide 2.5mg  bid rechecking labs today sending refill of Farxiga & Glipizide today f/u in 6 mos   Orders: -     Microalbumin / creatinine urine ratio -     Hemoglobin A1c -     Empagliflozin; Take 1 tablet (25 mg total) by mouth daily.   Dispense: 90 tablet; Refill: 1 -     glipiZIDE ER; TAKE 1 TABLET BY MOUTH EVERY DAY WITH BREAKFAST  Dispense: 180 tablet; Refill: 1 -     Basic metabolic panel  Hyperlipidemia associated with type 2 diabetes mellitus (HCC) Assessment & Plan: chronic taking Lipitor 20mg  qd last lipids wnl - LDL 78, slightly high Trigs sending refill today f/u in 1 yr   Orders: -     Atorvastatin Calcium; Take 1 tablet (20 mg total) by mouth daily.  Dispense: 90 tablet; Refill: 1  Moderate recurrent major depression (HCC) Assessment & Plan: chronic stable with Prozac 40mg  qd refill sent today f/u in 6 mos or prn   Need for hepatitis C screening test -     Hepatitis C antibody  Gastroesophageal reflux disease without esophagitis Assessment & Plan: chronic sx controlled with Prilosec qd sending refill f/u 60m- 76yr  Orders: -     Omeprazole; Take 1 capsule (40 mg total) by mouth daily.  Dispense: 90 capsule; Refill: 1   Subjective:    Outpatient Medications Prior to Visit  Medication Sig Dispense Refill   FLUoxetine (PROZAC) 40 MG capsule TAKE 1 CAPSULE (40 MG TOTAL) BY MOUTH DAILY. 90 capsule 1   atorvastatin (LIPITOR) 20 MG tablet TAKE 1 TABLET BY MOUTH EVERY DAY 90 tablet 0   empagliflozin (JARDIANCE) 25 MG TABS tablet Take 1 tablet (25 mg total) by mouth daily. 90 tablet 1   glipiZIDE (GLUCOTROL XL) 2.5 MG 24 hr tablet TAKE 1 TABLET BY MOUTH EVERY DAY WITH BREAKFAST 90 tablet 0  omeprazole (PRILOSEC) 40 MG capsule TAKE 1 CAPSULE (40 MG TOTAL) BY MOUTH DAILY. 90 capsule 0   No facility-administered medications prior to visit.   Past Medical History:  Diagnosis Date   Allergy    Arthritis    Depression    Diabetes mellitus without complication (HCC)    GERD (gastroesophageal reflux disease)    Hypercholesteremia    No past surgical history on file. No Known Allergies    Objective:    Physical Exam Vitals and nursing note reviewed.  Constitutional:      Appearance: Normal  appearance.  Cardiovascular:     Rate and Rhythm: Normal rate and regular rhythm.  Pulmonary:     Effort: Pulmonary effort is normal.     Breath sounds: Normal breath sounds.  Musculoskeletal:        General: Normal range of motion.  Skin:    General: Skin is warm and dry.  Neurological:     Mental Status: She is alert.  Psychiatric:        Mood and Affect: Mood normal.        Behavior: Behavior normal.    BP (!) 105/92 (BP Location: Left Arm, Patient Position: Sitting, Cuff Size: Large)   Pulse 62   Temp (!) 96.9 F (36.1 C) (Temporal)   Ht 5\' 4"  (1.626 m)   Wt 194 lb 8 oz (88.2 kg)   LMP  (LMP Unknown)   SpO2 94%   BMI 33.39 kg/m  Wt Readings from Last 3 Encounters:  06/28/22 194 lb 8 oz (88.2 kg)  12/27/21 194 lb 6 oz (88.2 kg)  09/29/21 191 lb 6.4 oz (86.8 kg)       Dulce Sellar, NP

## 2022-06-28 NOTE — Assessment & Plan Note (Signed)
chronic sx controlled with Prilosec qd sending refill f/u 50m- 74yr

## 2022-06-29 DIAGNOSIS — L570 Actinic keratosis: Secondary | ICD-10-CM | POA: Diagnosis not present

## 2022-06-29 DIAGNOSIS — L820 Inflamed seborrheic keratosis: Secondary | ICD-10-CM | POA: Diagnosis not present

## 2022-06-29 DIAGNOSIS — L821 Other seborrheic keratosis: Secondary | ICD-10-CM | POA: Diagnosis not present

## 2022-06-29 LAB — MICROALBUMIN / CREATININE URINE RATIO
Creatinine,U: 74.9 mg/dL
Microalb Creat Ratio: 0.9 mg/g (ref 0.0–30.0)
Microalb, Ur: <0.7 mg/dL (ref 0.0–1.9)

## 2022-06-29 LAB — HEPATITIS C ANTIBODY: Hepatitis C Ab: NONREACTIVE

## 2022-07-02 ENCOUNTER — Other Ambulatory Visit: Payer: Self-pay | Admitting: Family

## 2022-07-02 DIAGNOSIS — E1169 Type 2 diabetes mellitus with other specified complication: Secondary | ICD-10-CM

## 2022-07-02 MED ORDER — GLIPIZIDE ER 2.5 MG PO TB24: 7.5000 mg | ORAL_TABLET | ORAL | 0 refills | Status: DC

## 2022-07-10 DIAGNOSIS — M5432 Sciatica, left side: Secondary | ICD-10-CM | POA: Diagnosis not present

## 2022-07-10 DIAGNOSIS — M9903 Segmental and somatic dysfunction of lumbar region: Secondary | ICD-10-CM | POA: Diagnosis not present

## 2022-07-31 DIAGNOSIS — M5432 Sciatica, left side: Secondary | ICD-10-CM | POA: Diagnosis not present

## 2022-07-31 DIAGNOSIS — M9903 Segmental and somatic dysfunction of lumbar region: Secondary | ICD-10-CM | POA: Diagnosis not present

## 2022-08-21 DIAGNOSIS — M5432 Sciatica, left side: Secondary | ICD-10-CM | POA: Diagnosis not present

## 2022-08-21 DIAGNOSIS — M9903 Segmental and somatic dysfunction of lumbar region: Secondary | ICD-10-CM | POA: Diagnosis not present

## 2022-08-29 DIAGNOSIS — M9903 Segmental and somatic dysfunction of lumbar region: Secondary | ICD-10-CM | POA: Diagnosis not present

## 2022-08-29 DIAGNOSIS — M5432 Sciatica, left side: Secondary | ICD-10-CM | POA: Diagnosis not present

## 2022-09-14 ENCOUNTER — Encounter: Payer: Self-pay | Admitting: Family

## 2022-09-14 DIAGNOSIS — M5432 Sciatica, left side: Secondary | ICD-10-CM | POA: Diagnosis not present

## 2022-09-14 DIAGNOSIS — M9903 Segmental and somatic dysfunction of lumbar region: Secondary | ICD-10-CM | POA: Diagnosis not present

## 2022-09-17 ENCOUNTER — Other Ambulatory Visit: Payer: Self-pay | Admitting: Family

## 2022-09-17 DIAGNOSIS — E1169 Type 2 diabetes mellitus with other specified complication: Secondary | ICD-10-CM

## 2022-09-17 NOTE — Telephone Encounter (Signed)
any Jardiance or Farxiga samples?

## 2022-09-21 ENCOUNTER — Other Ambulatory Visit: Payer: Self-pay | Admitting: Family

## 2022-09-21 DIAGNOSIS — F331 Major depressive disorder, recurrent, moderate: Secondary | ICD-10-CM

## 2022-10-16 ENCOUNTER — Other Ambulatory Visit: Payer: Self-pay | Admitting: Family

## 2022-10-16 DIAGNOSIS — E785 Hyperlipidemia, unspecified: Secondary | ICD-10-CM

## 2022-10-19 ENCOUNTER — Ambulatory Visit: Payer: No Typology Code available for payment source

## 2022-10-19 LAB — HM DIABETES EYE EXAM

## 2022-10-25 DIAGNOSIS — M5432 Sciatica, left side: Secondary | ICD-10-CM | POA: Diagnosis not present

## 2022-10-25 DIAGNOSIS — M9903 Segmental and somatic dysfunction of lumbar region: Secondary | ICD-10-CM | POA: Diagnosis not present

## 2022-11-07 DIAGNOSIS — M5432 Sciatica, left side: Secondary | ICD-10-CM | POA: Diagnosis not present

## 2022-11-07 DIAGNOSIS — M9903 Segmental and somatic dysfunction of lumbar region: Secondary | ICD-10-CM | POA: Diagnosis not present

## 2022-11-22 DIAGNOSIS — M9903 Segmental and somatic dysfunction of lumbar region: Secondary | ICD-10-CM | POA: Diagnosis not present

## 2022-11-22 DIAGNOSIS — M5432 Sciatica, left side: Secondary | ICD-10-CM | POA: Diagnosis not present

## 2022-12-04 ENCOUNTER — Ambulatory Visit: Payer: PRIVATE HEALTH INSURANCE | Admitting: Dermatology

## 2022-12-13 DIAGNOSIS — M9903 Segmental and somatic dysfunction of lumbar region: Secondary | ICD-10-CM | POA: Diagnosis not present

## 2022-12-13 DIAGNOSIS — M5432 Sciatica, left side: Secondary | ICD-10-CM | POA: Diagnosis not present

## 2023-01-03 DIAGNOSIS — M5432 Sciatica, left side: Secondary | ICD-10-CM | POA: Diagnosis not present

## 2023-01-03 DIAGNOSIS — M9903 Segmental and somatic dysfunction of lumbar region: Secondary | ICD-10-CM | POA: Diagnosis not present

## 2023-01-14 ENCOUNTER — Other Ambulatory Visit: Payer: Self-pay | Admitting: Family

## 2023-01-14 DIAGNOSIS — E1169 Type 2 diabetes mellitus with other specified complication: Secondary | ICD-10-CM

## 2023-01-29 ENCOUNTER — Encounter: Payer: Self-pay | Admitting: Family

## 2023-01-29 ENCOUNTER — Other Ambulatory Visit: Payer: Self-pay

## 2023-01-29 DIAGNOSIS — E1169 Type 2 diabetes mellitus with other specified complication: Secondary | ICD-10-CM

## 2023-01-29 MED ORDER — EMPAGLIFLOZIN 25 MG PO TABS
25.0000 mg | ORAL_TABLET | Freq: Every day | ORAL | 1 refills | Status: DC
Start: 2023-01-29 — End: 2023-10-15

## 2023-02-01 ENCOUNTER — Other Ambulatory Visit: Payer: Self-pay | Admitting: Family

## 2023-02-01 DIAGNOSIS — E1169 Type 2 diabetes mellitus with other specified complication: Secondary | ICD-10-CM

## 2023-02-21 DIAGNOSIS — M9903 Segmental and somatic dysfunction of lumbar region: Secondary | ICD-10-CM | POA: Diagnosis not present

## 2023-02-21 DIAGNOSIS — M5432 Sciatica, left side: Secondary | ICD-10-CM | POA: Diagnosis not present

## 2023-03-06 DIAGNOSIS — M5432 Sciatica, left side: Secondary | ICD-10-CM | POA: Diagnosis not present

## 2023-03-06 DIAGNOSIS — M9903 Segmental and somatic dysfunction of lumbar region: Secondary | ICD-10-CM | POA: Diagnosis not present

## 2023-03-14 ENCOUNTER — Other Ambulatory Visit: Payer: Self-pay | Admitting: Family

## 2023-03-14 DIAGNOSIS — F331 Major depressive disorder, recurrent, moderate: Secondary | ICD-10-CM

## 2023-03-14 DIAGNOSIS — K219 Gastro-esophageal reflux disease without esophagitis: Secondary | ICD-10-CM

## 2023-03-15 DIAGNOSIS — M5432 Sciatica, left side: Secondary | ICD-10-CM | POA: Diagnosis not present

## 2023-03-15 DIAGNOSIS — M9903 Segmental and somatic dysfunction of lumbar region: Secondary | ICD-10-CM | POA: Diagnosis not present

## 2023-03-20 ENCOUNTER — Ambulatory Visit (INDEPENDENT_AMBULATORY_CARE_PROVIDER_SITE_OTHER): Payer: Self-pay | Admitting: Family Medicine

## 2023-03-20 ENCOUNTER — Encounter: Payer: Self-pay | Admitting: Family Medicine

## 2023-03-20 VITALS — BP 126/80 | HR 62 | Temp 97.7°F | Ht 64.0 in | Wt 196.0 lb

## 2023-03-20 DIAGNOSIS — M47816 Spondylosis without myelopathy or radiculopathy, lumbar region: Secondary | ICD-10-CM

## 2023-03-20 DIAGNOSIS — M545 Low back pain, unspecified: Secondary | ICD-10-CM

## 2023-03-20 DIAGNOSIS — Z780 Asymptomatic menopausal state: Secondary | ICD-10-CM

## 2023-03-20 DIAGNOSIS — Z1231 Encounter for screening mammogram for malignant neoplasm of breast: Secondary | ICD-10-CM | POA: Diagnosis not present

## 2023-03-20 MED ORDER — CYCLOBENZAPRINE HCL 10 MG PO TABS: 10.0000 mg | ORAL_TABLET | Freq: Every evening | ORAL | 0 refills | Status: DC | PRN

## 2023-03-20 NOTE — Patient Instructions (Signed)
 Please return for transfer of care/establish care visit with me. We will check your diabetes at that time.   We will call you with information regarding your referral appointment. Physical therapy.  If you do not hear from [place] within the next 2 weeks, please let me know. It can take 1-2 weeks to get appointments set up with the specialists.    If you have any questions or concerns, please don't hesitate to send me a message via MyChart or call the office at [phone]. Thank you for visiting with [place] today! It's our pleasure caring for you.   Please call the office checked below to schedule your appointment for your mammogram and/or bone density screen (the checked studies were ordered): [x]   Mammogram  [x]   Bone Density  [x]   [facility]     [address] [place], [place]        [phone]         []   [facility]  [address] [place], [place]  [phone]  Back Exercises The following exercises strengthen the muscles that help to support the trunk (torso) and back. They also help to keep the lower back flexible. Doing these exercises can help to prevent or lessen existing low back pain. If you have back pain or discomfort, try doing these exercises 2-3 times each day or as told by your health care provider. As your pain improves, do them once each day, but increase the number of times that you repeat the steps for each exercise (do more repetitions). To prevent the recurrence of back pain, continue to do these exercises once each day or as told by your health care provider. Do exercises exactly as told by your health care provider and adjust them as directed. It is normal to feel mild stretching, pulling, tightness, or discomfort as you do these exercises, but you should stop right away if you feel sudden pain or your pain gets worse. Exercises Single knee to chest Repeat these steps 3-5 times for each leg: Lie on your back on a firm bed  or the floor with your legs extended. Bring one knee to your chest. Your other leg should stay extended and in contact with the floor. Hold your knee in place by grabbing your knee or thigh with both hands and hold. Pull on your knee until you feel a gentle stretch in your lower back or buttocks. Hold the stretch for 10-30 seconds. Slowly release and straighten your leg.  Pelvic tilt Repeat these steps 5-10 times: Lie on your back on a firm bed or the floor with your legs extended. Bend your knees so they are pointing toward the ceiling and your feet are flat on the floor. Tighten your lower abdominal muscles to press your lower back against the floor. This motion will tilt your pelvis so your tailbone points up toward the ceiling instead of pointing to your feet or the floor. With gentle tension and even breathing, hold this position for 5-10 seconds.  Cat-cow Repeat these steps until your lower back becomes more flexible: Get into a hands-and-knees position on a firm bed or the floor. Keep your hands under your shoulders, and keep your knees under your hips. You may place padding under your knees for comfort. Let your head hang down toward your chest. Contract your abdominal muscles and point your tailbone toward the floor so your lower back becomes rounded like the back  of a cat. Hold this position for 5 seconds. Slowly lift your head, let your abdominal muscles relax, and point your tailbone up toward the ceiling so your back forms a sagging arch like the back of a cow. Hold this position for 5 seconds.  Press-ups Repeat these steps 5-10 times: Lie on your abdomen (face-down) on a firm bed or the floor. Place your palms near your head, about shoulder-width apart. Keeping your back as relaxed as possible and keeping your hips on the floor, slowly straighten your arms to raise the top half of your body and lift your shoulders. Do not use your back muscles to raise your upper torso. You  may adjust the placement of your hands to make yourself more comfortable. Hold this position for 5 seconds while you keep your back relaxed. Slowly return to lying flat on the floor.  Bridges Repeat these steps 10 times: Lie on your back on a firm bed or the floor. Bend your knees so they are pointing toward the ceiling and your feet are flat on the floor. Your arms should be flat at your sides, next to your body. Tighten your buttocks muscles and lift your buttocks off the floor until your waist is at almost the same height as your knees. You should feel the muscles working in your buttocks and the back of your thighs. If you do not feel these muscles, slide your feet 1-2 inches (2.5-5 cm) farther away from your buttocks. Hold this position for 3-5 seconds. Slowly lower your hips to the starting position, and allow your buttocks muscles to relax completely. If this exercise is too easy, try doing it with your arms crossed over your chest. Abdominal crunches Repeat these steps 5-10 times: Lie on your back on a firm bed or the floor with your legs extended. Bend your knees so they are pointing toward the ceiling and your feet are flat on the floor. Cross your arms over your chest. Tip your chin slightly toward your chest without bending your neck. Tighten your abdominal muscles and slowly raise your torso high enough to lift your shoulder blades a tiny bit off the floor. Avoid raising your torso higher than that because it can put too much stress on your lower back and does not help to strengthen your abdominal muscles. Slowly return to your starting position.  Back lifts Repeat these steps 5-10 times: Lie on your abdomen (face-down) with your arms at your sides, and rest your forehead on the floor. Tighten the muscles in your legs and your buttocks. Slowly lift your chest off the floor while you keep your hips pressed to the floor. Keep the back of your head in line with the curve in your  back. Your eyes should be looking at the floor. Hold this position for 3-5 seconds. Slowly return to your starting position.  Contact a health care provider if: Your back pain or discomfort gets much worse when you do an exercise. Your worsening back pain or discomfort does not lessen within 2 hours after you exercise. If you have any of these problems, stop doing these exercises right away. Do not do them again unless your health care provider says that you can. Get help right away if: You develop sudden, severe back pain. If this happens, stop doing the exercises right away. Do not do them again unless your health care provider says that you can. This information is not intended to replace advice given to you by your health care  provider. Make sure you discuss any questions you have with your health care provider. Document Revised: 02/12/2022 Document Reviewed: 03/24/2020 Elsevier Patient Education  2024 [employer].

## 2023-03-20 NOTE — Progress Notes (Signed)
 Subjective  CC:  Chief Complaint  Patient presents with   Back Pain    Pt stated that her back pain has been going on for the past 3 weeks ago in the middle of her back   Same day acute visit; PCP not available. New pt to me. Chart reviewed.    HPI: Emily Sims is a 68 y.o. female who presents to the office today to address the problems listed above in the chief complaint. Discussed the use of AI scribe software for clinical note transcription with the patient, who gave verbal consent to proceed.  History of Present Illness   Emily Sims is a 68 year old female with chronic lower back pain who presents with worsening back pain and leg weakness.  For the past three weeks, she has experienced worsening lower back pain. The pain is sharp, intermittent, and particularly pronounced when rising from a seated position, such as off the toilet. It can reach an intensity of 8 out of 10 and radiates to her upper back, predominantly on one side. Despite undergoing chiropractic adjustments, the pain persists. No bowel or bladder issues are present. The pain is not constant but occurs multiple times daily. She finds some relief by adjusting her sleeping position, specifically by drawing one leg out while lying on her side or stomach.  She experiences episodes of "leg weakness" but this is really more a giveway sensation due to the abrupt pain, likely from mm spasm. there is no numbness.  For pain management, she uses Tylenol but cannot take anti-inflammatory medications due to a history of ulcers from ibuprofen use. Tylenol provides minimal relief. She has not had recent x-rays or physical therapy for her back pain, although she recalls having physical therapy years ago.  In addition to her back pain, she has a history of diabetes, with her last A1C recorded at 7.2 in February of the previous year. She is currently on glipizide and Jardiance for diabetes management. She is trying to lose weight  and is overdue for DM f/u.  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 She suspects that the driving may be contributing to her back pain.       Assessment  1. Low back pain, episodic   2. Spondylosis of lumbar region without myelopathy or radiculopathy   3. Screening mammogram for breast cancer   4. Asymptomatic menopausal state      Plan  Assessment and Plan    Low Back Pain Intermittent sharp pain in the lower back, exacerbated by certain positions and movements, particularly when getting up from a seated position. No associated numbness or radiating pain. History of arthritis in the lower back. Recent increase in driving for work may be contributing to symptoms. -Start physical therapy for strengthening and core exercises. -Extra Strength Tylenol two tablets twice daily for pain management. -Add a muscle relaxer at night. -Heat and massage as needed. -Home exercises for back strengthening.  Upper Back Pain New onset of pain across the upper back, more on the right side. Possible muscle strain due to increased driving for work. -Physical therapy as above. -Extra Strength Tylenol as above. -Heat and massage as needed.  General Health Maintenance -Schedule mammogram and bone density     F/u: toc appt and f/u with me per patient request after approval from pcp.;   Orders Placed  This Encounter  Procedures   MM DIGITAL SCREENING BILATERAL   DG Bone Density   Ambulatory referral to Physical Therapy   Meds ordered this encounter  Medications   cyclobenzaprine (FLEXERIL) 10 MG tablet    Sig: Take 1 tablet (10 mg total) by mouth at bedtime as needed for muscle spasms.    Dispense:  30 tablet    Refill:  0     I reviewed the patients updated PMH, FH, and SocHx.    Patient Active Problem List   Diagnosis Date Noted   Type 2 diabetes mellitus with  hyperlipidemia (HCC) 09/29/2021   Hyperlipidemia associated with type 2 diabetes mellitus (HCC) 09/29/2021   Moderate recurrent major depression (HCC) 09/29/2021   GERD (gastroesophageal reflux disease) 09/29/2021   Current Meds  Medication Sig   atorvastatin (LIPITOR) 20 MG tablet Take 1 tablet (20 mg total) by mouth daily.   cyclobenzaprine (FLEXERIL) 10 MG tablet Take 1 tablet (10 mg total) by mouth at bedtime as needed for muscle spasms.   empagliflozin (JARDIANCE) 25 MG TABS tablet Take 1 tablet (25 mg total) by mouth daily.   FLUoxetine (PROZAC) 40 MG capsule TAKE 1 CAPSULE (40 MG TOTAL) BY MOUTH DAILY.   glipiZIDE (GLUCOTROL XL) 2.5 MG 24 hr tablet TAKE 3 TABLETS BY MOUTH AS DIRECTED. TAKE 2 TABLETS BY MOUTH DAILY AFTER BREAKFAST, AND 1 TABLET IN THE EVENING AFTER SUPPER.   omeprazole (PRILOSEC) 40 MG capsule Take 1 capsule (40 mg total) by mouth daily.    Allergies: Patient has no known allergies. Family History: Patient family history includes Alcohol abuse in her maternal grandfather; Arthritis in her father and mother; COPD in her brother and father; Cancer in her mother and paternal grandmother; Depression in her brother, father, and mother; Diabetes in her father and mother; Hearing loss in her mother; Heart attack in her father; Heart disease in her father. Social History:  Patient  reports that she has never smoked. She has never been exposed to tobacco smoke. She has never used smokeless tobacco. She reports that she does not drink alcohol and does not use drugs.  Review of Systems: Constitutional: Negative for fever malaise or anorexia Cardiovascular: negative for chest pain Respiratory: negative for SOB or persistent cough Gastrointestinal: negative for abdominal pain  Objective  Vitals: BP 126/80   Pulse 62   Temp 97.7 F (36.5 C)   Ht 5\' 4"  (1.626 m)   Wt 196 lb (88.9 kg)   LMP  (LMP Unknown)   SpO2 94%   BMI 33.64 kg/m  General: no acute distress ,  A&Ox3 Back: left lateral lumbar mm spasm and mild ttp. Otherwise nl exam. Neg slr bilaterally. Nl strength and gait.   Commons side effects, risks, benefits, and alternatives for medications and treatment plan prescribed today were discussed, and the patient expressed understanding of the given instructions. Patient is instructed to call or message via MyChart if he/she has any questions or concerns regarding our treatment plan. No barriers to understanding were identified. We discussed Red Flag symptoms and signs in detail. Patient expressed understanding regarding what to do in case of urgent or emergency type symptoms.  Medication list was reconciled, printed and provided to the patient in AVS. Patient instructions and summary information was reviewed with the patient as documented in the AVS. This note was prepared with assistance of Dragon voice recognition software. Occasional wrong-word or sound-a-like substitutions may have occurred due to the inherent limitations of voice recognition software

## 2023-03-21 ENCOUNTER — Other Ambulatory Visit: Payer: Self-pay | Admitting: Family Medicine

## 2023-03-21 DIAGNOSIS — Z780 Asymptomatic menopausal state: Secondary | ICD-10-CM

## 2023-03-27 DIAGNOSIS — M9903 Segmental and somatic dysfunction of lumbar region: Secondary | ICD-10-CM | POA: Diagnosis not present

## 2023-03-27 DIAGNOSIS — M5432 Sciatica, left side: Secondary | ICD-10-CM | POA: Diagnosis not present

## 2023-03-28 ENCOUNTER — Ambulatory Visit
Admission: RE | Admit: 2023-03-28 | Discharge: 2023-03-28 | Disposition: A | Discharge status: Home / Self Care | Payer: PPO | Source: Ambulatory Visit | Attending: Family Medicine | Admitting: Family Medicine

## 2023-03-28 DIAGNOSIS — Z1231 Encounter for screening mammogram for malignant neoplasm of breast: Secondary | ICD-10-CM | POA: Diagnosis not present

## 2023-04-03 DIAGNOSIS — M9903 Segmental and somatic dysfunction of lumbar region: Secondary | ICD-10-CM | POA: Diagnosis not present

## 2023-04-03 DIAGNOSIS — M5432 Sciatica, left side: Secondary | ICD-10-CM | POA: Diagnosis not present

## 2023-04-09 ENCOUNTER — Encounter: Payer: Self-pay | Admitting: Physical Therapy

## 2023-04-09 ENCOUNTER — Ambulatory Visit: Admitting: Physical Therapy

## 2023-04-09 DIAGNOSIS — M5459 Other low back pain: Secondary | ICD-10-CM

## 2023-04-09 DIAGNOSIS — M6281 Muscle weakness (generalized): Secondary | ICD-10-CM

## 2023-04-09 NOTE — Therapy (Signed)
 OUTPATIENT PHYSICAL THERAPY THORACOLUMBAR EVALUATION   Patient Name: Emily Sims MRN: [id number] DOB:November 21, 1955, 68 y.o., female Today's Date: 04/09/2023  END OF SESSION:  PT End of Session - 04/09/23 0932     Visit Number 1    Number of Visits 12    Date for PT Re-Evaluation 07/02/23    Authorization Type HTA    Progress Note Due on Visit 10    PT Start Time 0932    PT Stop Time 1010    PT Time Calculation (min) 38 min    Activity Tolerance Patient tolerated treatment well    Behavior During Therapy WFL for tasks assessed/performed             Past Medical History:  Diagnosis Date   Allergy    Arthritis    Depression    Diabetes mellitus without complication (HCC)    GERD (gastroesophageal reflux disease)    Hypercholesteremia    History reviewed. No pertinent surgical history. Patient Active Problem List   Diagnosis Date Noted   Type 2 diabetes mellitus with hyperlipidemia (HCC) 09/29/2021   Hyperlipidemia associated with type 2 diabetes mellitus (HCC) 09/29/2021   Moderate recurrent major depression (HCC) 09/29/2021   GERD (gastroesophageal reflux disease) 09/29/2021    PCP: Willow Ora, MD  REFERRING PROVIDER: Willow Ora, MD  REFERRING DIAG: M54.50 (ICD-10-CM) - Low back pain, episodic M47.816 (ICD-10-CM) - Spondylosis of lumbar region without myelopathy or radiculopathy  Rationale for Evaluation and Treatment: Rehabilitation  THERAPY DIAG:  Other low back pain  Muscle weakness (generalized)  ONSET DATE: years  SUBJECTIVE:                                                                                                                                                                                           SUBJECTIVE STATEMENT: States that she was having sciatica symptoms down her left side and along her right shoulder blade and it doesn't seem to be getting better.   States that the chiro said she had a rib out. States that her side  pain just came about. Sometimes it hurts at night. States her lower back has always been a problem and she gets episodic sciatica and that is better now.   States that she has exercises at home but doesn't do them.  States that she was going to the gym and was doing well but had to stop due to her schedule (job change) sitting/driving with job.      PERTINENT HISTORY:  DB  PAIN:  Are you having pain? Yes: NPRS scale: 5/10 Pain location: right upper back  Pain description:  achy Aggravating factors: reaching with her right arm  Relieving factors: unsure  PRECAUTIONS: None  RED FLAGS: None   WEIGHT BEARING RESTRICTIONS: No  FALLS:  Has patient fallen in last 6 months? 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  DIAGNOSTIC FINDINGS:  No recent imaging  PATIENT SURVEYS:  Patient-specific activity functional scoring scheme (Point to one number):  "0" represents "unable to perform." "10" represents "able to perform at prior level. 0 1 2 3 4 5 6 7 8 9  10 (Date and Score) Activity Initial  Activity Eval     Standing for long periods of time 4    Washing dishes  3    Sleeping  4     Additional Total score = sum of the activity scores/number of activities Minimum detectable change (90%CI) for average score = 2 points Minimum detectable change (90%CI) for single activity score = 3 points PSFS developed by: Jake Seats., & Binkley, J. (1995). Assessing disability and change on individual  patients: a report of a patient specific measure. Physiotherapy [place], 47, [phone]. Reproduced with the permission of the authors  Score: 11/3 = 2.66   COGNITION: Overall cognitive status: Within functional limits for tasks assessed     SENSATION: Not tested      POSTURE: rounded shoulders, forward head, decreased lumbar lordosis, posterior pelvic tilt, and flexed trunk   PALPATION: tender  Lumbar ROM:   AROM eval  Flexion 75% limited  Extension 75% limited*  Right lateral flexion 75% limited*  Left lateral flexion 75% limited*  Right rotation   Left rotation    (Blank rows = not tested) *= Pain    UE Measurements Upper Extremity Right EVAL Left EVAL   A/PROM MMT A/PROM MMT  Shoulder Flexion WFL 4 WFL 4  Shoulder Extension      Shoulder Abduction WFL 4 WFL 4  Shoulder Adduction      Shoulder Internal Rotation  3+*  3+  Shoulder External Rotation  3+*  3+  Elbow Flexion      Elbow Extension      Wrist Flexion      Wrist Extension      Wrist Supination      Wrist Pronation      Wrist Ulnar Deviation      Wrist Radial Deviation      Grip Strength NA  NA     (Blank rows = not tested)   * pain  LE Measurements Lower Extremity Right EVAL Left EVAL   A/PROM MMT A/PROM MMT  Hip Flexion WFL 4 WFL 4  Hip Extension WFL 3+ WFL 3+  Hip Abduction      Hip Adduction      Hip Internal rotation 20  20   Hip External rotation [facility]  WFL   Knee Flexion  4  4  Knee Extension  4    Ankle Dorsiflexion      Ankle Plantarflexion      Ankle Inversion      Ankle Eversion       (Blank rows = not tested) * pain   LUMBAR SPECIAL TESTS:  Straight leg raise test: Negative, Slump test: Negative, and Ely's test + B      TREATMENT DATE:  04/09/2023  Therapeutic Exercise:  Review of anatomy and rationale behind mobility exercises and current presentation  Supine: Prone: hamstring curls x10 5" holds B  Seated:  Standing: Neuromuscular Re-education: long exhale breathing 10 minutes - verbal and tactile cues for reduced cervical muscle activation with elbows bent. Manual Therapy: IASTM to right thoracic  paraspinals 8 minutes Therapeutic Activity: Self Care: Trigger Point Dry Needling:  Modalities:     PATIENT EDUCATION:  Education details: on current presentation, on HEP, on clinical outcomes score and POC, on importance of breathing and exercises for mobility. On anatomy. Person educated: Patient Education method: Explanation, Demonstration, and Handouts Education comprehension: verbalized understanding   HOME EXERCISE PROGRAM: [id number] long exhale breathing with elbows bent   ASSESSMENT:   CLINICAL IMPRESSION: Patient presents with chronic low back pain and acute upper back pain. Upper back pain started a few weeks ago and is primary complaint at this time. Patient presents with limited rib mobility and primarily uses cervical musculature to breath. Educated patient in current presentation, importance of proper posture and importance of daily mobility to help with pain. Patient presents with pain, posture limitations and strength deficits and would greatly benefit from skilled PT to address physical impairment and improve overall function and quality of life.   OBJECTIVE IMPAIRMENTS: decreased activity tolerance, decreased mobility, difficulty walking, decreased ROM, decreased strength, improper body mechanics, postural dysfunction, and pain.   ACTIVITY LIMITATIONS: carrying, lifting, sitting, standing, sleeping, transfers, and locomotion level  PARTICIPATION LIMITATIONS: cleaning, driving, and community activity  PERSONAL FACTORS: Age, Fitness, Time since onset of injury/illness/exacerbation, and 1 comorbidity: DB  are also affecting patient's functional outcome.   REHAB POTENTIAL: Good  CLINICAL DECISION MAKING: Stable/uncomplicated  EVALUATION COMPLEXITY: Low   GOALS: Goals reviewed with patient? yes  SHORT TERM GOALS: Target date: 05/21/2023  Patient will be independent in self management strategies to improve quality of life and functional outcomes. Baseline: New  Program Goal status: INITIAL  2.  Patient will report at least 50% improvement in overall symptoms and/or function to demonstrate improved functional mobility Baseline: 0% better Goal status: INITIAL  3.  Patient will be abel to demonstrate at least 5 second exhale without cervical muscle engagement to demonstrated improved diaphragmatic/core activation Baseline: unable with cervical muscle activation Goal status: INITIAL       LONG TERM GOALS: Target date: 07/02/2023   Patient will report at least 75% improvement in overall symptoms and/or function to demonstrate improved functional mobility Baseline: 0% better Goal status: INITIAL  2.  Patient will score at least 2 points higher on PSFS average to demonstrate change in overall function. Baseline: see above Goal status: INITIAL  3.  Patient will demonstrate painfree lumbar ROM.  Baseline: painful Goal status: INITIAL     PLAN:  PT FREQUENCY: 1-2x/week  PT DURATION: 12 weeks  PLANNED INTERVENTIONS: 97110-Therapeutic exercises, 97530- Therapeutic activity, [id number]- Neuromuscular re-education, 97535- Self Care, 54098- Manual therapy, [phone]- Gait training, [phone]- Orthotic Fit/training, [phone]- Canalith repositioning, [id number]- Aquatic Therapy, 97014- Electrical stimulation (unattended), [phone]- Ionotophoresis 4mg /ml Dexamethasone, Patient/Family education, Balance training, Stair training, Taping, Dry Needling, Joint mobilization, Joint manipulation, Spinal manipulation, Spinal mobilization, Cryotherapy, and Moist heat   PLAN FOR NEXT SESSION: breathing/posture exercises, focus on short HEP development- retry percussion with education for self mobilization at home.    11:23 AM, 04/09/23 Tereasa Coop, DPT Physical Therapy with [facility]

## 2023-04-16 ENCOUNTER — Encounter: Payer: Self-pay | Admitting: Physical Therapy

## 2023-04-16 ENCOUNTER — Ambulatory Visit: Admitting: Physical Therapy

## 2023-04-16 DIAGNOSIS — M5459 Other low back pain: Secondary | ICD-10-CM

## 2023-04-16 DIAGNOSIS — M6281 Muscle weakness (generalized): Secondary | ICD-10-CM | POA: Diagnosis not present

## 2023-04-16 NOTE — Therapy (Signed)
 OUTPATIENT PHYSICAL THERAPY THORACOLUMBAR TREATMENT   Patient Name: Emily Sims MRN: [id number] DOB:05-04-1955, 68 y.o., female Today's Date: 04/16/2023  END OF SESSION:  PT End of Session - 04/16/23 0846     Visit Number 2    Number of Visits 12    Date for PT Re-Evaluation 07/02/23    Authorization Type HTA    Progress Note Due on Visit 10    PT Start Time [phone]    PT Stop Time 0925    PT Time Calculation (min) 39 min    Activity Tolerance Patient tolerated treatment well    Behavior During Therapy WFL for tasks assessed/performed              Past Medical History:  Diagnosis Date   Allergy    Arthritis    Depression    Diabetes mellitus without complication (HCC)    GERD (gastroesophageal reflux disease)    Hypercholesteremia    History reviewed. No pertinent surgical history. Patient Active Problem List   Diagnosis Date Noted   Type 2 diabetes mellitus with hyperlipidemia (HCC) 09/29/2021   Hyperlipidemia associated with type 2 diabetes mellitus (HCC) 09/29/2021   Moderate recurrent major depression (HCC) 09/29/2021   GERD (gastroesophageal reflux disease) 09/29/2021    PCP: Willow Ora, MD  REFERRING PROVIDER: Willow Ora, MD  REFERRING DIAG: M54.50 (ICD-10-CM) - Low back pain, episodic M47.816 (ICD-10-CM) - Spondylosis of lumbar region without myelopathy or radiculopathy  Rationale for Evaluation and Treatment: Rehabilitation  THERAPY DIAG:  Other low back pain  Muscle weakness (generalized)  ONSET DATE: years  SUBJECTIVE:                                                                                                                                                                                           SUBJECTIVE STATEMENT: 04/16/2023 States she is feeling better no pain over the weekend. Slight soreness along shoulder  blade. No difficulties with exercises.   EVAL: States that she was having sciatica symptoms down her left side  and along her right shoulder blade and it doesn't seem to be getting better.   States that the chiro said she had a rib out. States that her side pain just came about. Sometimes it hurts at night. States her lower back has always been a problem and she gets episodic sciatica and that is better now.   States that she has exercises at home but doesn't do them.  States that she was going to the gym and was doing well but had to stop due to her schedule (job change) sitting/driving with job.  PERTINENT HISTORY:  DB  PAIN:  Are you having pain? Yes: NPRS scale: 1/10 Pain location: right upper back  Pain description: achy Aggravating factors: reaching with her right arm  Relieving factors: unsure  PRECAUTIONS: None  RED FLAGS: None   WEIGHT BEARING RESTRICTIONS: No  FALLS:  Has patient fallen in last 6 months? 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  DIAGNOSTIC FINDINGS:  No recent imaging  PATIENT SURVEYS:  Patient-specific activity functional scoring scheme (Point to one number):  "0" represents "unable to perform." "10" represents "able to perform at prior level. 0 1 2 3 4 5 6 7 8 9  10 (Date and Score) Activity Initial  Activity Eval     Standing for long periods of time 4    Washing dishes  3    Sleeping  4     Additional Total score = sum of the activity scores/number of activities Minimum detectable change (90%CI) for average score = 2 points Minimum detectable change (90%CI) for single activity score = 3 points PSFS developed by: Jake Seats., & Binkley, J. (1995). Assessing disability and change on individual  patients: a report of a patient specific measure. Physiotherapy [place], 47, [phone]. Reproduced with the permission of the  authors  Score: 11/3 = 2.66   COGNITION: Overall cognitive status: Within functional limits for tasks assessed     SENSATION: Not tested     POSTURE: rounded shoulders, forward head, decreased lumbar lordosis, posterior pelvic tilt, and flexed trunk   PALPATION: tender  Lumbar ROM:   AROM eval  Flexion 75% limited  Extension 75% limited*  Right lateral flexion 75% limited*  Left lateral flexion 75% limited*  Right rotation   Left rotation    (Blank rows = not tested) *= Pain    UE Measurements Upper Extremity Right EVAL Left EVAL   A/PROM MMT A/PROM MMT  Shoulder Flexion WFL 4 WFL 4  Shoulder Extension      Shoulder Abduction WFL 4 WFL 4  Shoulder Adduction      Shoulder Internal Rotation  3+*  3+  Shoulder External Rotation  3+*  3+  Elbow Flexion      Elbow Extension      Wrist Flexion      Wrist Extension      Wrist Supination      Wrist Pronation      Wrist Ulnar Deviation      Wrist Radial Deviation      Grip Strength NA  NA     (Blank rows = not tested)   * pain  LE Measurements Lower Extremity Right EVAL Left EVAL   A/PROM MMT A/PROM MMT  Hip Flexion WFL 4 WFL 4  Hip Extension WFL 3+ WFL 3+  Hip Abduction      Hip Adduction      Hip Internal rotation 20  20   Hip External rotation [facility]   Knee Flexion  4  4  Knee Extension  4    Ankle Dorsiflexion      Ankle Plantarflexion      Ankle Inversion      Ankle Eversion       (Blank rows = not tested) * pain   LUMBAR SPECIAL TESTS:  Straight leg raise  test: Negative, Slump test: Negative, and Ely's test + B      TREATMENT DATE:                                                                                                                               04/16/2023  Therapeutic Exercise:  Review of anatomy and rationale behind mobility exercises and current presentation, about mental practice and benefits Supine: Prone: hamstring curls x5 5" holds  B  Seated:  Standing: Neuromuscular Re-education: long exhale breathing with focus on scapular protraction training PROM/AAROM/AROM - 10 minutes, Prone belly breathing with tactile  cues focus on lower rib expansion 5 minutes total Lateral posterior rib expansion with reach in supine and standing - 10 minutes  Manual Therapy: STM to right thoracic paraspinals 8 minutes Therapeutic Activity: Self Care: Trigger Point Dry Needling:  Modalities:     PATIENT EDUCATION:  Education details: on HEP, on benefits of mental practice Person educated: Patient Education method: Explanation, Demonstration, and Handouts Education comprehension: verbalized understanding   HOME EXERCISE PROGRAM: [id number] long exhale breathing with elbows bent Reaching towards ceiling with posterior lateral rib breathing   ASSESSMENT:   CLINICAL IMPRESSION: 04/16/2023 Overall patient doing well and reporting less pain. Reviewed previous exercises and progressed breath work as able. Minimal lower rib expansion noted but discussed practice with mental practice and tactile cues. Overall patient doing good will continue to benefit from skilled PT at this time.    Eval: Patient presents with chronic low back pain and acute upper back pain. Upper back pain started a few weeks ago and is primary complaint at this time. Patient presents with limited rib mobility and primarily uses cervical musculature to breath. Educated patient in current presentation, importance of proper posture and importance of daily mobility to help with pain. Patient presents with pain, posture limitations and strength deficits and would greatly benefit from skilled PT to address physical impairment and improve overall function and quality of life.   OBJECTIVE IMPAIRMENTS: decreased activity tolerance, decreased mobility, difficulty walking, decreased ROM, decreased strength, improper body mechanics, postural dysfunction, and pain.   ACTIVITY  LIMITATIONS: carrying, lifting, sitting, standing, sleeping, transfers, and locomotion level  PARTICIPATION LIMITATIONS: cleaning, driving, and community activity  PERSONAL FACTORS: Age, Fitness, Time since onset of injury/illness/exacerbation, and 1 comorbidity: DB  are also affecting patient's functional outcome.   REHAB POTENTIAL: Good  CLINICAL DECISION MAKING: Stable/uncomplicated  EVALUATION COMPLEXITY: Low   GOALS: Goals reviewed with patient? yes  SHORT TERM GOALS: Target date: 05/21/2023  Patient will be independent in self management strategies to improve quality of life and functional outcomes. Baseline: New Program Goal status: INITIAL  2.  Patient will report at least 50% improvement in overall symptoms and/or function to demonstrate improved functional mobility Baseline: 0% better Goal status: INITIAL  3.  Patient will be abel to demonstrate at least 5 second exhale without cervical muscle engagement to demonstrated improved diaphragmatic/core activation Baseline: unable  with cervical muscle activation Goal status: INITIAL       LONG TERM GOALS: Target date: 07/02/2023   Patient will report at least 75% improvement in overall symptoms and/or function to demonstrate improved functional mobility Baseline: 0% better Goal status: INITIAL  2.  Patient will score at least 2 points higher on PSFS average to demonstrate change in overall function. Baseline: see above Goal status: INITIAL  3.  Patient will demonstrate painfree lumbar ROM.  Baseline: painful Goal status: INITIAL     PLAN:  PT FREQUENCY: 1-2x/week  PT DURATION: 12 weeks  PLANNED INTERVENTIONS: 97110-Therapeutic exercises, 97530- Therapeutic activity, [id number]- Neuromuscular re-education, 97535- Self Care, 16109- Manual therapy, [phone]- Gait training, [phone]- Orthotic Fit/training, [phone]- Canalith repositioning, [id number]- Aquatic Therapy, 97014- Electrical stimulation (unattended), [phone]- Ionotophoresis  4mg /ml Dexamethasone, Patient/Family education, Balance training, Stair training, Taping, Dry Needling, Joint mobilization, Joint manipulation, Spinal manipulation, Spinal mobilization, Cryotherapy, and Moist heat   PLAN FOR NEXT SESSION: breathing/posture exercises, focus on short HEP development- retry percussion with education for self mobilization at home.    9:45 AM, 04/16/23 Tereasa Coop, DPT Physical Therapy with [place]

## 2023-04-22 ENCOUNTER — Other Ambulatory Visit: Payer: Self-pay | Admitting: Family

## 2023-04-22 DIAGNOSIS — E1169 Type 2 diabetes mellitus with other specified complication: Secondary | ICD-10-CM

## 2023-04-24 ENCOUNTER — Telehealth: Payer: Self-pay | Admitting: Family

## 2023-04-24 ENCOUNTER — Ambulatory Visit: Admitting: Physical Therapy

## 2023-04-24 ENCOUNTER — Encounter: Payer: Self-pay | Admitting: Physical Therapy

## 2023-04-24 DIAGNOSIS — M5459 Other low back pain: Secondary | ICD-10-CM | POA: Diagnosis not present

## 2023-04-24 DIAGNOSIS — M6281 Muscle weakness (generalized): Secondary | ICD-10-CM | POA: Diagnosis not present

## 2023-04-24 NOTE — Telephone Encounter (Signed)
 While checking out from PT, patient stated she spoke with Dr. Mardelle Matte and was informed she could transfer care from [facility] to [facility]. Is this okay?

## 2023-04-24 NOTE — Therapy (Signed)
 OUTPATIENT PHYSICAL THERAPY THORACOLUMBAR TREATMENT   Patient Name: Emily Sims MRN: [id number] DOB:01/26/1955, 68 y.o., female Today's Date: 04/24/2023  END OF SESSION:  PT End of Session - 04/24/23 0848     Visit Number 3    Number of Visits 12    Date for PT Re-Evaluation 07/02/23    Authorization Type HTA    Progress Note Due on Visit 10    PT Start Time 0848    PT Stop Time 0926    PT Time Calculation (min) 38 min    Activity Tolerance Patient tolerated treatment well    Behavior During Therapy WFL for tasks assessed/performed              Past Medical History:  Diagnosis Date   Allergy    Arthritis    Depression    Diabetes mellitus without complication (HCC)    GERD (gastroesophageal reflux disease)    Hypercholesteremia    History reviewed. No pertinent surgical history. Patient Active Problem List   Diagnosis Date Noted   Type 2 diabetes mellitus with hyperlipidemia (HCC) 09/29/2021   Hyperlipidemia associated with type 2 diabetes mellitus (HCC) 09/29/2021   Moderate recurrent major depression (HCC) 09/29/2021   GERD (gastroesophageal reflux disease) 09/29/2021    PCP: Willow Ora, MD  REFERRING PROVIDER: Willow Ora, MD  REFERRING DIAG: M54.50 (ICD-10-CM) - Low back pain, episodic M47.816 (ICD-10-CM) - Spondylosis of lumbar region without myelopathy or radiculopathy  Rationale for Evaluation and Treatment: Rehabilitation  THERAPY DIAG:  Other low back pain  Muscle weakness (generalized)  ONSET DATE: years  SUBJECTIVE:                                                                                                                                                                                           SUBJECTIVE STATEMENT: 04/24/2023 States she feeling better. States she didn't do her exercises as much as she would like. States she feels 80% better since the start of PT.  EVAL: States that she was having sciatica symptoms down her  left side and along her right shoulder blade and it doesn't seem to be getting better.   States that the chiro said she had a rib out. States that her side pain just came about. Sometimes it hurts at night. States her lower back has always been a problem and she gets episodic sciatica and that is better now.   States that she has exercises at home but doesn't do them.  States that she was going to the gym and was doing well but had to stop due to her schedule (job change) sitting/driving  with job.      PERTINENT HISTORY:  DB  PAIN:  Are you having pain? Yes: NPRS scale: 0/10 Pain location: right upper back  Pain description: achy Aggravating factors: reaching with her right arm  Relieving factors: unsure  PRECAUTIONS: None  RED FLAGS: None   WEIGHT BEARING RESTRICTIONS: No  FALLS:  Has patient fallen in last 6 months? 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  DIAGNOSTIC FINDINGS:  No recent imaging  PATIENT SURVEYS:  Patient-specific activity functional scoring scheme (Point to one number):  "0" represents "unable to perform." "10" represents "able to perform at prior level. 0 1 2 3 4 5 6 7 8 9  10 (Date and Score) Activity Initial  Activity Eval   4/1  Standing for long periods of time 4  4  Washing dishes  3 8   Sleeping  4 8    Additional Total score = sum of the activity scores/number of activities Minimum detectable change (90%CI) for average score = 2 points Minimum detectable change (90%CI) for single activity score = 3 points PSFS developed by: Jake Seats., & Binkley, J. (1995). Assessing disability and change on individual  patients: a report of a patient specific measure. Physiotherapy [place], [phone], [phone]. Reproduced with the  permission of the authors  Score: 11/3 = 2.66 4/1 20/3= 6.6  COGNITION: Overall cognitive status: Within functional limits for tasks assessed     SENSATION: Not tested     POSTURE: rounded shoulders, forward head, decreased lumbar lordosis, posterior pelvic tilt, and flexed trunk   PALPATION: tender  Lumbar ROM:   AROM eval  Flexion 75% limited  Extension 75% limited*  Right lateral flexion 75% limited*  Left lateral flexion 75% limited*  Right rotation   Left rotation    (Blank rows = not tested) *= Pain    UE Measurements Upper Extremity Right EVAL Left EVAL   A/PROM MMT A/PROM MMT  Shoulder Flexion WFL 4 WFL 4  Shoulder Extension      Shoulder Abduction WFL 4 WFL 4  Shoulder Adduction      Shoulder Internal Rotation  3+*  3+  Shoulder External Rotation  3+*  3+  Elbow Flexion      Elbow Extension      Wrist Flexion      Wrist Extension      Wrist Supination      Wrist Pronation      Wrist Ulnar Deviation      Wrist Radial Deviation      Grip Strength NA  NA     (Blank rows = not tested)   * pain  LE Measurements Lower Extremity Right EVAL Left EVAL   A/PROM MMT A/PROM MMT  Hip Flexion WFL 4 WFL 4  Hip Extension [facility] 3+ WFL 3+  Hip Abduction      Hip Adduction      Hip Internal rotation 20  20   Hip External rotation [facility]   Knee Flexion  4  4  Knee Extension  4    Ankle Dorsiflexion      Ankle Plantarflexion      Ankle Inversion      Ankle Eversion       (Blank rows = not tested) * pain  LUMBAR SPECIAL TESTS:  Straight leg raise test: Negative, Slump test: Negative, and Ely's test + B      TREATMENT DATE:                                                                                                                               04/24/2023  Therapeutic Exercise:   Prone: hamstring curls 2 minutes B, POE 2 minutes, prone extension 2 minutes, review of HEP and all exercises.   Seated:  Standing: self mobilization to T spine  musculature with tennis ball and pillow case 6 minutes  Neuromuscular Re-education: long exhale breathing with  and without focus on scapular protraction training PROM/AAROM/AROM - 8 minutes, Prone belly breathing with tactile  cues focus on lower rib expansion 8 minutes total Lateral posterior rib expansion with reach in supine and standing - 8 minutes  Manual Therapy:   Therapeutic Activity: Self Care: Trigger Point Dry Needling:  Modalities:     PATIENT EDUCATION:  Education details: on HEP  Person educated: Patient Education method: Programmer, multimedia, Demonstration, and Handouts Education comprehension: verbalized understanding   HOME EXERCISE PROGRAM: [id number] long exhale breathing with elbows bent Reaching towards ceiling with posterior lateral rib breathing   ASSESSMENT:   CLINICAL IMPRESSION: 04/24/2023 Focused on review of exercises and answering all questions 2 long term goals met and one short term goal met at this time. Discussed frequency of PT visits and patient would like to focus on current HEP and f/u in 4 weeks. Encouraged patient to schedule sooner apts if needed. Will continue to benefit from PT at this time.    Eval: Patient presents with chronic low back pain and acute upper back pain. Upper back pain started a few weeks ago and is primary complaint at this time. Patient presents with limited rib mobility and primarily uses cervical musculature to breath. Educated patient in current presentation, importance of proper posture and importance of daily mobility to help with pain. Patient presents with pain, posture limitations and strength deficits and would greatly benefit from skilled PT to address physical impairment and improve overall function and quality of life.   OBJECTIVE IMPAIRMENTS: decreased activity tolerance, decreased mobility, difficulty walking, decreased ROM, decreased strength, improper body mechanics, postural dysfunction, and pain.   ACTIVITY  LIMITATIONS: carrying, lifting, sitting, standing, sleeping, transfers, and locomotion level  PARTICIPATION LIMITATIONS: cleaning, driving, and community activity  PERSONAL FACTORS: Age, Fitness, Time since onset of injury/illness/exacerbation, and 1 comorbidity: DB  are also affecting patient's functional outcome.   REHAB POTENTIAL: Good  CLINICAL DECISION MAKING: Stable/uncomplicated  EVALUATION COMPLEXITY: Low   GOALS: Goals reviewed with patient? yes  SHORT TERM GOALS: Target date: 05/21/2023  Patient will be independent in self management strategies to improve quality of life and functional outcomes. Baseline: New Program Goal status: PROGRESSING  2.  Patient will report at least 50% improvement in overall symptoms and/or function to demonstrate improved functional mobility Baseline: 0% better Goal  status: MET  3.  Patient will be abel to demonstrate at least 5 second exhale without cervical muscle engagement to demonstrated improved diaphragmatic/core activation Baseline: unable with cervical muscle activation Goal status: PROGRESSING       LONG TERM GOALS: Target date: 07/02/2023   Patient will report at least 75% improvement in overall symptoms and/or function to demonstrate improved functional mobility Baseline: 0% better Goal status: MET  2.  Patient will score at least 2 points higher on PSFS average to demonstrate change in overall function. Baseline: see above Goal status: MET  3.  Patient will demonstrate painfree lumbar ROM.  Baseline: painful Goal status: PROGRESSING     PLAN:  PT FREQUENCY: 1-2x/week  PT DURATION: 12 weeks  PLANNED INTERVENTIONS: 97110-Therapeutic exercises, 97530- Therapeutic activity, [id number]- Neuromuscular re-education, 97535- Self Care, 16109- Manual therapy, [phone]- Gait training, [phone]- Orthotic Fit/training, [phone]- Canalith repositioning, [id number]- Aquatic Therapy, [id number]- Electrical stimulation (unattended), [phone]- Ionotophoresis  4mg /ml Dexamethasone, Patient/Family education, Balance training, Stair training, Taping, Dry Needling, Joint mobilization, Joint manipulation, Spinal manipulation, Spinal mobilization, Cryotherapy, and Moist heat   PLAN FOR NEXT SESSION: breathing/posture exercises, focus on short HEP development- retry percussion with education for self mobilization at home.    9:29 AM, 04/24/23 Tereasa Coop, DPT Physical Therapy with [facility]

## 2023-05-03 ENCOUNTER — Ambulatory Visit: Admitting: Family Medicine

## 2023-05-03 ENCOUNTER — Encounter: Payer: Self-pay | Admitting: Family Medicine

## 2023-05-03 VITALS — BP 123/76 | HR 58 | Temp 97.7°F | Ht 64.0 in | Wt 190.0 lb

## 2023-05-03 DIAGNOSIS — E669 Obesity, unspecified: Secondary | ICD-10-CM | POA: Diagnosis not present

## 2023-05-03 DIAGNOSIS — N3281 Overactive bladder: Secondary | ICD-10-CM

## 2023-05-03 DIAGNOSIS — R350 Frequency of micturition: Secondary | ICD-10-CM

## 2023-05-03 DIAGNOSIS — Z8711 Personal history of peptic ulcer disease: Secondary | ICD-10-CM | POA: Insufficient documentation

## 2023-05-03 DIAGNOSIS — Z23 Encounter for immunization: Secondary | ICD-10-CM | POA: Diagnosis not present

## 2023-05-03 DIAGNOSIS — E1169 Type 2 diabetes mellitus with other specified complication: Secondary | ICD-10-CM

## 2023-05-03 DIAGNOSIS — Z7984 Long term (current) use of oral hypoglycemic drugs: Secondary | ICD-10-CM | POA: Diagnosis not present

## 2023-05-03 DIAGNOSIS — K219 Gastro-esophageal reflux disease without esophagitis: Secondary | ICD-10-CM

## 2023-05-03 DIAGNOSIS — E1165 Type 2 diabetes mellitus with hyperglycemia: Secondary | ICD-10-CM

## 2023-05-03 DIAGNOSIS — E785 Hyperlipidemia, unspecified: Secondary | ICD-10-CM | POA: Diagnosis not present

## 2023-05-03 DIAGNOSIS — F331 Major depressive disorder, recurrent, moderate: Secondary | ICD-10-CM

## 2023-05-03 LAB — LIPID PANEL
Cholesterol: 145 mg/dL (ref 0–200)
HDL: 50.8 mg/dL (ref >39.00)
LDL Cholesterol: 60 mg/dL (ref 0–99)
NonHDL: 93.99
Total CHOL/HDL Ratio: 3
Triglycerides: 169 mg/dL — ABNORMAL HIGH (ref 0.0–149.0)
VLDL: 33.8 mg/dL (ref 0.0–40.0)

## 2023-05-03 LAB — COMPREHENSIVE METABOLIC PANEL WITH GFR
ALT: 17 U/L (ref 0–35)
AST: 16 U/L (ref 0–37)
Albumin: 4.1 g/dL (ref 3.5–5.2)
Alkaline Phosphatase: 72 U/L (ref 39–117)
BUN: 18 mg/dL (ref 6–23)
CO2: 25 meq/L (ref 19–32)
Calcium: 8.9 mg/dL (ref 8.4–10.5)
Chloride: 101 meq/L (ref 96–112)
Creatinine, Ser: 0.79 mg/dL (ref 0.40–1.20)
GFR: 76.8 mL/min (ref >60.00)
Glucose, Bld: 137 mg/dL — ABNORMAL HIGH (ref 70–99)
Potassium: 4 meq/L (ref 3.5–5.1)
Sodium: 138 meq/L (ref 135–145)
Total Bilirubin: 0.7 mg/dL (ref 0.2–1.2)
Total Protein: 7 g/dL (ref 6.0–8.3)

## 2023-05-03 LAB — POCT GLYCOSYLATED HEMOGLOBIN (HGB A1C): Hemoglobin A1C: 7 % — AB (ref 4.0–5.6)

## 2023-05-03 LAB — POCT URINALYSIS DIPSTICK
Bilirubin, UA: NEGATIVE
Glucose, UA: POSITIVE — AB
Ketones, UA: NEGATIVE
Leukocytes, UA: NEGATIVE
Nitrite, UA: NEGATIVE
Protein, UA: NEGATIVE
Spec Grav, UA: 1.02 (ref 1.010–1.025)
Urobilinogen, UA: 0.2 U/dL
pH, UA: 5.5 (ref 5.0–8.0)

## 2023-05-03 LAB — TSH: TSH: 1.56 u[IU]/mL (ref 0.35–5.50)

## 2023-05-03 LAB — URINALYSIS, MICROSCOPIC ONLY

## 2023-05-03 LAB — CBC WITH DIFFERENTIAL/PLATELET
Basophils Absolute: 0 10*3/uL (ref 0.0–0.1)
Basophils Relative: 0.6 % (ref 0.0–3.0)
Eosinophils Absolute: 0.1 10*3/uL (ref 0.0–0.7)
Eosinophils Relative: 2.6 % (ref 0.0–5.0)
HCT: 45.1 % (ref 36.0–46.0)
Hemoglobin: 14.8 g/dL (ref 12.0–15.0)
Lymphocytes Relative: 27.9 % (ref 12.0–46.0)
Lymphs Abs: 1.4 10*3/uL (ref 0.7–4.0)
MCHC: 32.9 g/dL (ref 30.0–36.0)
MCV: 92.6 fl (ref 78.0–100.0)
Monocytes Absolute: 0.5 10*3/uL (ref 0.1–1.0)
Monocytes Relative: 10.7 % (ref 3.0–12.0)
Neutro Abs: 3 10*3/uL (ref 1.4–7.7)
Neutrophils Relative %: 58.2 % (ref 43.0–77.0)
Platelets: 240 10*3/uL (ref 150.0–400.0)
RBC: 4.87 Mil/uL (ref 3.87–5.11)
RDW: 13.6 % (ref 11.5–15.5)
WBC: 5.1 10*3/uL (ref 4.0–10.5)

## 2023-05-03 LAB — MICROALBUMIN / CREATININE URINE RATIO
Creatinine,U: 68.9 mg/dL
Microalb Creat Ratio: UNDETERMINED mg/g (ref 0.0–30.0)
Microalb, Ur: <0.7 mg/dL

## 2023-05-03 MED ORDER — METFORMIN HCL 1000 MG PO TABS: 1000.0000 mg | ORAL_TABLET | Freq: Two times a day (BID) | ORAL | 3 refills | Status: DC

## 2023-05-03 NOTE — Patient Instructions (Addendum)
 Please return in 3 months for diabetes follow up   I will release your lab results to you on your MyChart account with further instructions. You may see the results before I do, but when I review them I will send you a message with my report or have my assistant call you if things need to be discussed. Please reply to my message with any questions. Thank you!   If you have any questions or concerns, please don't hesitate to send me a message via MyChart or call the office at [phone]. Thank you for visiting with [place] today! It's our pleasure caring for you.   VISIT SUMMARY:  Today, we discussed your recent urinary frequency and urgency, your diabetes management, and your history of acid-induced stomach ulcers. We also reviewed your general health maintenance and vaccination needs.  YOUR PLAN:  -DIABETES MELLITUS TYPE 2: Diabetes is a condition where your blood sugar levels are too high. We will continue your current medication, Jardiance, and reintroduce metformin to better control your blood sugar. You should start with 1000 mg of metformin daily for 5 days, then increase to 1000 mg twice daily if you tolerate it well. We will stop Glucotrol. We also ordered lab work to check your cholesterol, kidney, liver, and urine microalbuminuria levels. Please follow up in 3 months to reassess your A1c. Please eat a diabetic diet.  -GASTROESOPHAGEAL REFLUX DISEASE (GERD): GERD is a condition where stomach acid frequently flows back into the tube connecting your mouth and stomach. Your GERD is well-controlled with omeprazole, so please continue taking 40 mg daily.  -OVERACTIVE BLADDER: Overactive bladder is a condition where you feel a sudden urge to urinate that is difficult to control. To help manage this, try reducing your caffeine intake, especially iced tea. If your symptoms persist, we may consider medication.  - continue lipitor and prozac.  -GENERAL HEALTH MAINTENANCE: We reviewed your general  health and determined that you are eligible for the Pneumovax vaccine due to your increased risk of infection from diabetes. We also ordered routine lab work to check your cholesterol, kidney, liver, and urine microalbuminuria levels.  INSTRUCTIONS:  Please follow up in 3 months to reassess your A1c and discuss your lab results. If your urinary symptoms persist, contact us to discuss further treatment options.

## 2023-05-03 NOTE — Progress Notes (Signed)
 Subjective  CC:  Chief Complaint  Patient presents with   transfer of care   Urinary Frequency    Frequent urination with an urgency for the past 3 weeks   TOC; I have reviewed her chart.  Divorced female, lives alone, has family here in [place].  Moved here from [place] approximately 2 years ago.  Working a part-time job, driving.  Happy.  HPI: Emily Sims is a 68 y.o. female who presents to the office today for follow up of diabetes and problems listed above in the chief complaint.  Discussed the use of AI scribe software for clinical note transcription with the patient, who gave verbal consent to proceed.  History of Present Illness   Emily Sims is a 68 year old female with diabetes who presents with urinary frequency and urgency. And to establish care.   She has been experiencing increased urinary frequency and urgency over the past two to three weeks, primarily in the afternoons while sitting and driving. There is a sensation of needing to urinate frequently and sometimes an urge to go again shortly after urinating. No burning sensation during urination is noted, and she typically wakes up once at night to urinate. She manages the urgency by holding it for up to two hours, which causes discomfort in her stomach.  She has a history of diabetes for approximately ten years, which has been challenging to control. Her current medications include Jardiance 25 mg daily and Glucotrol, though she previously used metformin without side effects. Her last recorded A1c was 7.2%. No symptoms of diabetic neuropathy, such as tingling or burning pain in her feet, are reported, and there are no issues with her eyes or kidneys related to diabetes.  She is normotensive.  She is not on an ACE inhibitor nor ARB.  Eye exam is current.  She has a history of NSAID-induced stomach ulcers, previously exacerbated by meloxicam use. She is currently on omeprazole 40 mg daily, which she finds necessary to  manage her symptoms. She has undergone two endoscopies in the past, which revealed multiple ulcers, but no H. pylori infection or hernia. She avoids ibuprofen and meloxicam.  Her symptoms are currently well-controlled.  She has a history of high cholesterol, for which she is taking Lipitor. She denies any history of high blood pressure. She has undergone a colonoscopy, which was normal, and reports no family history of colon cancer.  She is retired, lives alone, and has a part-time job. She moved from [place] to [place] and then to her current location two years ago. She has children, including a daughter who lives nearby.      Wt Readings from Last 3 Encounters:  05/03/23 190 lb (86.2 kg)  03/20/23 196 lb (88.9 kg)  06/28/22 194 lb 8 oz (88.2 kg)    BP Readings from Last 3 Encounters:  05/03/23 123/76  03/20/23 126/80  06/28/22 (!) 105/92    Assessment  1. Uncontrolled type 2 diabetes mellitus with hyperglycemia (HCC)   2. Frequent urination   3. Moderate recurrent major depression (HCC) Chronic  4. Hyperlipidemia associated with type 2 diabetes mellitus (HCC) Chronic  5. Gastroesophageal reflux disease without esophagitis   6. Need for pneumococcal 20-valent conjugate vaccination   7. History of peptic ulcer   8. Overactive bladder   9. Obesity (BMI 30-39.9)      Plan  Assessment and Plan    Diabetes Mellitus Type 2, uncontrolled Diabetes for 10 years, A1c at 7.2.  -  Continue Jardiance 25 mg daily. - Start metformin 1000 mg daily for 5 days, then increase to 1000 mg twice daily if tolerated. - Discontinue Glucotrol. -Improve diet and exercise.  Work on weight loss. - Order lab work: cholesterol, kidney, liver, urine microalbuminuria. - Follow up in 3 months to reassess A1c.  Gastroesophageal Reflux Disease (GERD) GERD controlled with omeprazole. Acid-induced ulcers likely from past meloxicam use. - Continue omeprazole 40 mg daily.  Hyperlipidemia on Lipitor 20 mg fasting  for recheck today.  Tolerates well.  Check LFTs.  Goal LDL less than 70.  Overactive Bladder Urinary urgency and frequency, no UTI. Suspected overactive bladder, possibly caffeine-related. - Reduce caffeine intake, particularly iced tea. - Consider medication if symptoms persist.  Education given.  General Health Maintenance Eligible for Pneumovax due to increased infection risk from diabetes. - Administer Pneumovax vaccine. - Order routine lab work: cholesterol, kidney, liver, urine microalbuminuria.     I spent a total of 42 minutes for this patient encounter. Time spent included preparation, face-to-face counseling with the patient and coordination of care, review of chart and records, and documentation of the encounter.  Follow up: 3 months recheck diabetes Orders Placed This Encounter  Procedures   Pneumococcal conjugate vaccine 20-valent (Prevnar 20)   CBC with Differential/Platelet   Comprehensive metabolic panel with GFR   Lipid panel   TSH   Microalbumin / creatinine urine ratio   Urine Microscopic Only   POCT Urinalysis Dipstick   POCT HgB A1C   Meds ordered this encounter  Medications   metFORMIN (GLUCOPHAGE) 1000 MG tablet    Sig: Take 1 tablet (1,000 mg total) by mouth 2 (two) times daily with a meal.    Dispense:  180 tablet    Refill:  3      Immunization History  Administered Date(s) Administered   Influenza, High Dose Seasonal PF 01/25/2023   Janssen (J&J) SARS-COV-2 Vaccination 04/08/2019   Moderna Covid-19 Fall Seasonal Vaccine 60yrs & older 01/25/2023   Pneumococcal Conjugate-13 11/22/2020   Zoster Recombinant(Shingrix) 02/17/2021, 04/27/2021    Diabetes Related Lab Review: Lab Results  Component Value Date   HGBA1C 7.0 (A) 05/03/2023   HGBA1C 7.2 (H) 06/28/2022   HGBA1C 7.4 (H) 12/27/2021    Lab Results  Component Value Date   MICROALBUR <0.7 06/28/2022   Lab Results  Component Value Date   CREATININE 0.85 06/28/2022   BUN 18 06/28/2022    NA 141 06/28/2022   K 4.2 06/28/2022   CL 103 06/28/2022   CO2 24 06/28/2022   Lab Results  Component Value Date   CHOL 165 12/27/2021   Lab Results  Component Value Date   HDL 56.20 12/27/2021   No results found for: "LDLCALC" Lab Results  Component Value Date   TRIG 213.0 (H) 12/27/2021   Lab Results  Component Value Date   CHOLHDL 3 12/27/2021   Lab Results  Component Value Date   LDLDIRECT 78.0 12/27/2021   The 10-year ASCVD risk score (Arnett DK, et al., 2019) is: 12.3%   Values used to calculate the score:     Age: 19 years     Sex: Female     Is Non-Hispanic African American: No     Diabetic: Yes     Tobacco smoker: No     Systolic Blood Pressure: 123 mmHg     Is BP treated: No     HDL Cholesterol: 56.2 mg/dL     Total Cholesterol: 165 mg/dL I have reviewed the  PMH, Fam and Soc history. Patient Active Problem List   Diagnosis Date Noted Date Diagnosed   Obesity (BMI 30-39.9) 05/03/2023     Priority: High   Uncontrolled type 2 diabetes mellitus with hyperglycemia (HCC) 09/29/2021     Priority: High     intolerance with Metformin    Hyperlipidemia associated with type 2 diabetes mellitus (HCC) 09/29/2021     Priority: High    Lipitor 20    Moderate recurrent major depression (HCC) 09/29/2021     Priority: High   Overactive bladder 05/03/2023     Priority: Medium    History of peptic ulcer 05/03/2023     Priority: Medium     History of nsaid induced ulcers; EGD. Neg H.pylori, neg HH    GERD (gastroesophageal reflux disease) 09/29/2021     Priority: Medium     Prilosec chronic, high dose per GI, [place] History of nsaid induced ulcers; EGD. Neg H.pylori, neg HH     Social History: Patient  reports that she has never smoked. She has never been exposed to tobacco smoke. She has never used smokeless tobacco. She reports that she does not drink alcohol and does not use drugs.  Review of Systems: Ophthalmic: negative for eye pain, loss of vision or  double vision Cardiovascular: negative for chest pain Respiratory: negative for SOB or persistent cough Gastrointestinal: negative for abdominal pain Genitourinary: negative for dysuria or gross hematuria MSK: negative for foot lesions Neurologic: negative for weakness or gait disturbance  Objective  Vitals: BP 123/76   Pulse (!) 58   Temp 97.7 F (36.5 C)   Ht 5\' 4"  (1.626 m)   Wt 190 lb (86.2 kg)   LMP  (LMP Unknown)   SpO2 96%   BMI 32.61 kg/m  General: well appearing, no acute distress  Psych:  Alert and oriented, normal mood and affect HEENT:  Normocephalic, atraumatic, moist mucous membranes, supple neck  Cardiovascular:  Nl S1 and S2, RRR without murmur, gallop or rub. no edema Respiratory:  Good breath sounds bilaterally, CTAB with normal effort, no rales Foot exam: no erythema, pallor, or cyanosis visible nl proprioception and sensation to monofilament testing bilaterally, +2 distal pulses bilaterally   Diabetic education: ongoing education regarding chronic disease management for diabetes was given today. We continue to reinforce the ABC's of diabetic management: A1c (<7 or 8 dependent upon patient), tight blood pressure control, and cholesterol management with goal LDL < 100 minimally. We discuss diet strategies, exercise recommendations, medication options and possible side effects. At each visit, we review recommended immunizations and preventive care recommendations for diabetics and stress that good diabetic control can prevent other problems. See below for this patient's data.   Commons side effects, risks, benefits, and alternatives for medications and treatment plan prescribed today were discussed, and the patient expressed understanding of the given instructions. Patient is instructed to call or message via MyChart if he/she has any questions or concerns regarding our treatment plan. No barriers to understanding were identified. We discussed Red Flag symptoms and  signs in detail. Patient expressed understanding regarding what to do in case of urgent or emergency type symptoms.  Medication list was reconciled, printed and provided to the patient in AVS. Patient instructions and summary information was reviewed with the patient as documented in the AVS. This note was prepared with assistance of Dragon voice recognition software. Occasional wrong-word or sound-a-like substitutions may have occurred due to the inherent limitations of voice recognition software

## 2023-05-04 ENCOUNTER — Telehealth: Admitting: Family

## 2023-05-04 DIAGNOSIS — R399 Unspecified symptoms and signs involving the genitourinary system: Secondary | ICD-10-CM

## 2023-05-05 MED ORDER — CEPHALEXIN 500 MG PO CAPS: 500.0000 mg | ORAL_CAPSULE | Freq: Two times a day (BID) | ORAL | 0 refills | Status: DC

## 2023-05-05 NOTE — Progress Notes (Signed)

## 2023-05-14 ENCOUNTER — Encounter: Payer: Self-pay | Admitting: Family Medicine

## 2023-05-14 DIAGNOSIS — E1165 Type 2 diabetes mellitus with hyperglycemia: Secondary | ICD-10-CM

## 2023-05-14 DIAGNOSIS — R3129 Other microscopic hematuria: Secondary | ICD-10-CM | POA: Insufficient documentation

## 2023-05-14 NOTE — Progress Notes (Signed)
 See mychart note Dear Emily Sims, It was good seeing you.  I have reviewed your results.  Most things look good. The changes to your medications should get your diabetes under good control. Your kidneys and liver are normal.  However, your urine does show a few red blood cells and white blood cells without bacteria. I will need to repeat this test and if red blood cells persist, we will need to look further into it. If your symptoms persist, please return to see me so I can recheck your urine sooner. Your cholesterol levels are good on the medication. Take care and I will see you again soon.  Sincerely, Dr. Jonelle Neri

## 2023-05-15 DIAGNOSIS — M9903 Segmental and somatic dysfunction of lumbar region: Secondary | ICD-10-CM | POA: Diagnosis not present

## 2023-05-15 DIAGNOSIS — M5432 Sciatica, left side: Secondary | ICD-10-CM | POA: Diagnosis not present

## 2023-05-15 MED ORDER — METFORMIN HCL ER 500 MG PO TB24: ORAL_TABLET | ORAL | 3 refills | Status: AC

## 2023-05-22 ENCOUNTER — Encounter: Payer: Self-pay | Admitting: Physical Therapy

## 2023-05-22 ENCOUNTER — Ambulatory Visit: Admitting: Physical Therapy

## 2023-05-22 DIAGNOSIS — M6281 Muscle weakness (generalized): Secondary | ICD-10-CM | POA: Diagnosis not present

## 2023-05-22 DIAGNOSIS — M5459 Other low back pain: Secondary | ICD-10-CM

## 2023-05-22 NOTE — Therapy (Addendum)
 PHYSICAL THERAPY DISCHARGE SUMMARY  Visits from Start of Care: 4  Current functional level related to goals / functional outcomes: See below   Remaining deficits: See below   Education / Equipment: See below   Patient agrees to discharge. Patient goals were partially met. Patient is being discharged due to not returning since the last visit.  8:59 AM, 06/25/23 Tabitha Ewings, DPT Physical Therapy with [facility]   OUTPATIENT PHYSICAL THERAPY THORACOLUMBAR TREATMENT   Patient Name: Emily Sims MRN: [id number] DOB:May 26, 1955, 68 y.o., female Today's Date: 05/22/2023  END OF SESSION:  PT End of Session - 05/22/23 0845     Visit Number 4    Number of Visits 12    Date for PT Re-Evaluation 07/02/23    Authorization Type HTA    Progress Note Due on Visit 10    PT Start Time [phone]    PT Stop Time 0924    PT Time Calculation (min) 38 min    Activity Tolerance Patient tolerated treatment well    Behavior During Therapy WFL for tasks assessed/performed              Past Medical History:  Diagnosis Date   Allergy    Arthritis    Depression    Diabetes mellitus without complication (HCC)    GERD (gastroesophageal reflux disease)    Hypercholesteremia    History reviewed. No pertinent surgical history. Patient Active Problem List   Diagnosis Date Noted   Microscopic hematuria 05/14/2023   Overactive bladder 05/03/2023   History of peptic ulcer 05/03/2023   Obesity (BMI 30-39.9) 05/03/2023   Uncontrolled type 2 diabetes mellitus with hyperglycemia (HCC) 09/29/2021   Hyperlipidemia associated with type 2 diabetes mellitus (HCC) 09/29/2021   Moderate recurrent major depression (HCC) 09/29/2021   GERD (gastroesophageal reflux disease) 09/29/2021    PCP: Luevenia Saha, MD  REFERRING PROVIDER: Luevenia Saha, MD  REFERRING DIAG: M54.50 (ICD-10-CM) - Low back pain, episodic M47.816 (ICD-10-CM) - Spondylosis of lumbar region without myelopathy or  radiculopathy  Rationale for Evaluation and Treatment: Rehabilitation  THERAPY DIAG:  Other low back pain  Muscle weakness (generalized)  ONSET DATE: years  SUBJECTIVE:                                                                                                                                                                                           SUBJECTIVE STATEMENT: 05/22/2023 States upper back is better but the lower back is still bothering her. States she thinks it is bothering her from sitting in the car.   EVAL: States that she was having sciatica symptoms down her left  side and along her right shoulder blade and it doesn't seem to be getting better.   States that the chiro said she had a rib out. States that her side pain just came about. Sometimes it hurts at night. States her lower back has always been a problem and she gets episodic sciatica and that is better now.   States that she has exercises at home but doesn't do them.  States that she was going to the gym and was doing well but had to stop due to her schedule (job change) sitting/driving with job.      PERTINENT HISTORY:  DB  PAIN:  Are you having pain? Yes: NPRS scale: 5/10 Pain location: lower back  Pain description: achy Aggravating factors: reaching with her right arm  Relieving factors: unsure  PRECAUTIONS: None  RED FLAGS: None   WEIGHT BEARING RESTRICTIONS: No  FALLS:  Has patient fallen in last 6 months? 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  DIAGNOSTIC FINDINGS:  No recent imaging  PATIENT SURVEYS:  Patient-specific activity functional scoring scheme (Point to one number):  "0" represents "unable to perform." "10" represents "able to perform at prior level. 0 1 2 3 4 5 6 7 8 9   10 (Date and Score) Activity Initial  Activity Eval   4/1  Standing for long periods of time 4  4  Washing dishes  3 8   Sleeping  4 8    Additional Total score = sum of the activity scores/number of activities Minimum detectable change (90%CI) for average score = 2 points Minimum detectable change (90%CI) for single activity score = 3 points PSFS developed by: [place] Spitz., & Binkley, J. (1995). Assessing disability and change on individual  patients: a report of a patient specific measure. Physiotherapy [place], 47, [phone]. Reproduced with the permission of the authors  Score: 11/3 = 2.66 4/1 20/3= 6.6  COGNITION: Overall cognitive status: Within functional limits for tasks assessed     SENSATION: Not tested     POSTURE: rounded shoulders, forward head, decreased lumbar lordosis, posterior pelvic tilt, and flexed trunk   PALPATION: tender  Lumbar ROM:   AROM eval  Flexion 75% limited  Extension 75% limited*  Right lateral flexion 75% limited*  Left lateral flexion 75% limited*  Right rotation   Left rotation    (Blank rows = not tested) *= Pain    UE Measurements Upper Extremity Right EVAL Left EVAL   A/PROM MMT A/PROM MMT  Shoulder Flexion WFL 4 WFL 4  Shoulder Extension      Shoulder Abduction WFL 4 WFL 4  Shoulder Adduction      Shoulder Internal Rotation  3+*  3+  Shoulder External Rotation  3+*  3+  Elbow Flexion      Elbow Extension      Wrist Flexion      Wrist Extension      Wrist Supination      Wrist Pronation      Wrist Ulnar Deviation      Wrist Radial Deviation      Grip Strength NA  NA     (Blank rows = not tested)   * pain  LE Measurements Lower Extremity Right EVAL Left EVAL   A/PROM MMT A/PROM MMT  Hip Flexion WFL 4 WFL 4  Hip Extension WFL 3+ WFL 3+  Hip Abduction      Hip Adduction      Hip Internal rotation 20  20   Hip External rotation [facility]  WFL   Knee Flexion  4  4  Knee Extension  4     Ankle Dorsiflexion      Ankle Plantarflexion      Ankle Inversion      Ankle Eversion       (Blank rows = not tested) * pain   LUMBAR SPECIAL TESTS:  Straight leg raise test: Negative, Slump test: Negative, and Ely's test + B      TREATMENT DATE:                                                                                                                               05/22/2023  Therapeutic Exercise:   Prone: 2 minutes prone lying, hamstring curls 2 minutes B, POE 2 minutes, prone extension 2 minutes, review of HEP and all exercises.   Seated: Supine: 90/90 relief 2 minutes, LTR on ball 2 minutes, SAQs 2 minutes, DKC 2 minutes   Reviewed entire HEP Neuromuscular Re-education:   Manual Therapy:   Therapeutic Activity: Self Care: Trigger Point Dry Needling:  Modalities: thermotherapy to lumbar spine during prone exercises    PATIENT EDUCATION:  Education details: on HEP, on importance of performing daily stretches to combat low back tightness  Person educated: Patient Education method: Programmer, multimedia, Demonstration, and Handouts Education comprehension: verbalized understanding   HOME EXERCISE PROGRAM: [id number] long exhale breathing with elbows bent Reaching towards ceiling with posterior lateral rib breathing   ASSESSMENT:   CLINICAL IMPRESSION: 05/22/2023 Reviewed HEP and answered all questions about frequency of low back stretches with current work routine (drives about 24 hours/week for her job). Added new exercises and discussed how and when to perform all exercises. Will f/u in 4 weeks to determine ability to adhere to current HEP as well as overall low back symptoms.    Eval: Patient presents with chronic low back pain and acute upper back pain. Upper back pain started a few weeks ago and is primary complaint at this time. Patient presents with limited rib mobility and primarily uses cervical musculature to breath. Educated patient in current presentation,  importance of proper posture and importance of daily mobility to help with pain. Patient presents with pain, posture limitations and strength deficits and would greatly benefit from skilled PT to address physical impairment and improve overall function and quality of life.   OBJECTIVE IMPAIRMENTS: decreased activity tolerance, decreased mobility, difficulty walking, decreased ROM, decreased strength, improper body mechanics, postural dysfunction, and pain.   ACTIVITY LIMITATIONS: carrying, lifting, sitting, standing, sleeping, transfers, and locomotion level  PARTICIPATION LIMITATIONS: cleaning, driving, and community activity  PERSONAL FACTORS: Age, Fitness, Time since onset of injury/illness/exacerbation, and 1 comorbidity: DB are also affecting patient's functional outcome.   REHAB  POTENTIAL: Good  CLINICAL DECISION MAKING: Stable/uncomplicated  EVALUATION COMPLEXITY: Low   GOALS: Goals reviewed with patient? yes  SHORT TERM GOALS: Target date: 05/21/2023  Patient will be independent in self management strategies to improve quality of life and functional outcomes. Baseline: New Program Goal status: PROGRESSING  2.  Patient will report at least 50% improvement in overall symptoms and/or function to demonstrate improved functional mobility Baseline: 0% better Goal status: MET  3.  Patient will be abel to demonstrate at least 5 second exhale without cervical muscle engagement to demonstrated improved diaphragmatic/core activation Baseline: unable with cervical muscle activation Goal status: PROGRESSING       LONG TERM GOALS: Target date: 07/02/2023   Patient will report at least 75% improvement in overall symptoms and/or function to demonstrate improved functional mobility Baseline: 0% better Goal status: MET  2.  Patient will score at least 2 points higher on PSFS average to demonstrate change in overall function. Baseline: see above Goal status: MET  3.  Patient will  demonstrate painfree lumbar ROM.  Baseline: painful Goal status: PROGRESSING     PLAN:  PT FREQUENCY: 1-2x/week  PT DURATION: 12 weeks  PLANNED INTERVENTIONS: 97110-Therapeutic exercises, 97530- Therapeutic activity, [id number]- Neuromuscular re-education, 97535- Self Care, 62130- Manual therapy, [phone]- Gait training, [phone]- Orthotic Fit/training, [phone]- Canalith repositioning, [id number]- Aquatic Therapy, [id number]- Electrical stimulation (unattended), [phone]- Ionotophoresis 4mg /ml Dexamethasone, Patient/Family education, Balance training, Stair training, Taping, Dry Needling, Joint mobilization, Joint manipulation, Spinal manipulation, Spinal mobilization, Cryotherapy, and Moist heat   PLAN FOR NEXT SESSION: breathing/posture exercises, focus on short HEP development- retry percussion with education for self mobilization at home.    9:30 AM, 05/22/23 Tabitha Ewings, DPT Physical Therapy with [place]

## 2023-05-31 ENCOUNTER — Other Ambulatory Visit: Payer: Self-pay | Admitting: Family Medicine

## 2023-05-31 DIAGNOSIS — K219 Gastro-esophageal reflux disease without esophagitis: Secondary | ICD-10-CM

## 2023-06-12 ENCOUNTER — Other Ambulatory Visit: Payer: Self-pay | Admitting: Family Medicine

## 2023-06-12 DIAGNOSIS — E1169 Type 2 diabetes mellitus with other specified complication: Secondary | ICD-10-CM

## 2023-06-12 DIAGNOSIS — M5432 Sciatica, left side: Secondary | ICD-10-CM | POA: Diagnosis not present

## 2023-06-12 DIAGNOSIS — M9903 Segmental and somatic dysfunction of lumbar region: Secondary | ICD-10-CM | POA: Diagnosis not present

## 2023-06-14 ENCOUNTER — Encounter: Payer: Self-pay | Admitting: Family Medicine

## 2023-06-19 ENCOUNTER — Encounter: Admitting: Physical Therapy

## 2023-06-19 NOTE — Telephone Encounter (Signed)
 Noted

## 2023-07-05 ENCOUNTER — Other Ambulatory Visit: Payer: Self-pay | Admitting: Family Medicine

## 2023-07-05 DIAGNOSIS — F331 Major depressive disorder, recurrent, moderate: Secondary | ICD-10-CM

## 2023-07-10 DIAGNOSIS — M9903 Segmental and somatic dysfunction of lumbar region: Secondary | ICD-10-CM | POA: Diagnosis not present

## 2023-07-10 DIAGNOSIS — M5432 Sciatica, left side: Secondary | ICD-10-CM | POA: Diagnosis not present

## 2023-07-18 DIAGNOSIS — H04122 Dry eye syndrome of left lacrimal gland: Secondary | ICD-10-CM | POA: Diagnosis not present

## 2023-08-02 ENCOUNTER — Encounter: Payer: Self-pay | Admitting: Family Medicine

## 2023-08-02 ENCOUNTER — Ambulatory Visit

## 2023-08-02 ENCOUNTER — Ambulatory Visit: Admitting: Family Medicine

## 2023-08-02 VITALS — BP 118/68 | HR 66 | Temp 98.2°F | Ht 64.5 in | Wt 191.4 lb

## 2023-08-02 VITALS — BP 105/72 | HR 62 | Temp 97.7°F | Ht 64.5 in | Wt 190.0 lb

## 2023-08-02 DIAGNOSIS — B372 Candidiasis of skin and nail: Secondary | ICD-10-CM | POA: Diagnosis not present

## 2023-08-02 DIAGNOSIS — M6740 Ganglion, unspecified site: Secondary | ICD-10-CM

## 2023-08-02 DIAGNOSIS — Z7984 Long term (current) use of oral hypoglycemic drugs: Secondary | ICD-10-CM

## 2023-08-02 DIAGNOSIS — E1169 Type 2 diabetes mellitus with other specified complication: Secondary | ICD-10-CM | POA: Diagnosis not present

## 2023-08-02 DIAGNOSIS — E1165 Type 2 diabetes mellitus with hyperglycemia: Secondary | ICD-10-CM | POA: Diagnosis not present

## 2023-08-02 DIAGNOSIS — E785 Hyperlipidemia, unspecified: Secondary | ICD-10-CM

## 2023-08-02 DIAGNOSIS — Z0000 Encounter for general adult medical examination without abnormal findings: Secondary | ICD-10-CM

## 2023-08-02 DIAGNOSIS — E669 Obesity, unspecified: Secondary | ICD-10-CM

## 2023-08-02 DIAGNOSIS — R3129 Other microscopic hematuria: Secondary | ICD-10-CM | POA: Diagnosis not present

## 2023-08-02 LAB — POCT GLYCOSYLATED HEMOGLOBIN (HGB A1C): Hemoglobin A1C: 7.2 % — AB (ref 4.0–5.6)

## 2023-08-02 MED ORDER — GLIPIZIDE ER 5 MG PO TB24
5.0000 mg | ORAL_TABLET | Freq: Every day | ORAL | 3 refills | Status: DC
Start: 2023-08-02 — End: 2023-11-02

## 2023-08-02 MED ORDER — NYSTATIN 100000 UNIT/GM EX POWD: 1.0000 | Freq: Three times a day (TID) | CUTANEOUS | 0 refills | Status: AC

## 2023-08-02 NOTE — Progress Notes (Addendum)
 Subjective:   ILEAH FALKENSTEIN is a 68 y.o. who presents for a Medicare Wellness preventive visit.  As a reminder, Annual Wellness Visits don't include a physical exam, and some assessments may be limited, especially if this visit is performed virtually. We may recommend an in-person follow-up visit with your provider if needed.  Visit Complete: In person    Persons Participating in Visit: Patient.  AWV Questionnaire: Yes: Patient Medicare AWV questionnaire was completed by the patient on 07/28/23; I have confirmed that all information answered by patient is correct and no changes since this date.  Cardiac Risk Factors include: advanced age (>82men, >38 women);dyslipidemia;diabetes mellitus;obesity (BMI >30kg/m2)     Objective:    Today's Vitals   08/02/23 0753  BP: 118/68  Pulse: 66  Temp: 98.2 F (36.8 C)  SpO2: 93%  Weight: 191 lb 6.4 oz (86.8 kg)  Height: 5' 4.5 (1.638 m)   Body mass index is 32.35 kg/m.     08/02/2023    7:57 AM  Advanced Directives  Does Patient Have a Medical Advance Directive? No  Would patient like information on creating a medical advance directive? No - Patient declined    Current Medications (verified) Outpatient Encounter Medications as of 08/02/2023  Medication Sig   atorvastatin  (LIPITOR) 20 MG tablet TAKE 1 TABLET BY MOUTH EVERY DAY   empagliflozin  (JARDIANCE ) 25 MG TABS tablet Take 1 tablet (25 mg total) by mouth daily.   FLUoxetine  (PROZAC ) 40 MG capsule TAKE 1 CAPSULE (40 MG TOTAL) BY MOUTH DAILY.   loteprednol (LOTEMAX) 0.5 % ophthalmic suspension PLEASE SEE ATTACHED FOR DETAILED DIRECTIONS   metFORMIN  (GLUCOPHAGE -XR) 500 MG 24 hr tablet Take 1 tablet (500 mg total) by mouth daily with breakfast for 14 days, THEN 2 tablets (1,000 mg total) daily with breakfast.   omeprazole  (PRILOSEC) 40 MG capsule TAKE 1 CAPSULE (40 MG TOTAL) BY MOUTH DAILY.   [DISCONTINUED] cephALEXin  (KEFLEX ) 500 MG capsule Take 1 capsule (500 mg total) by mouth  2 (two) times daily.   [DISCONTINUED] cyclobenzaprine  (FLEXERIL ) 10 MG tablet Take 1 tablet (10 mg total) by mouth at bedtime as needed for muscle spasms. (Patient not taking: Reported on 08/02/2023)   No facility-administered encounter medications on file as of 08/02/2023.    Allergies (verified) Patient has no known allergies.   History: Past Medical History:  Diagnosis Date   Allergy    Arthritis    Depression    Diabetes mellitus without complication (HCC)    GERD (gastroesophageal reflux disease)    Hypercholesteremia    History reviewed. No pertinent surgical history. Family History  Problem Relation Age of Onset   Breast cancer Mother    Arthritis Mother    Cancer Mother    Depression Mother    Diabetes Mother    Hearing loss Mother    Arthritis Father    COPD Father    Depression Father    Diabetes Father    Heart attack Father    Heart disease Father    Alcohol abuse Maternal Grandfather    Cancer Paternal Grandmother    COPD Brother    Depression Brother    Social History   Socioeconomic History   Marital status: Divorced    Spouse name: Not on file   Number of children: Not on file   Years of education: Not on file   Highest education level: 12th grade  Occupational History   Not on file  Tobacco Use   Smoking status: Never  Passive exposure: Never   Smokeless tobacco: Never  Vaping Use   Vaping status: Never Used  Substance and Sexual Activity   Alcohol use: Never   Drug use: Never   Sexual activity: Never    Birth control/protection: None  Other Topics Concern   Not on file  Social History Narrative   Not on file   Social Drivers of Health   Financial Resource Strain: Low Risk  (07/28/2023)   Overall Financial Resource Strain (CARDIA)    Difficulty of Paying Living Expenses: Not very hard  Food Insecurity: No Food Insecurity (07/28/2023)   Hunger Vital Sign    Worried About Running Out of Food in the Last Year: Never true    Ran Out of  Food in the Last Year: Never true  Transportation Needs: No Transportation Needs (07/28/2023)   PRAPARE - Administrator, Civil Service (Medical): No    Lack of Transportation (Non-Medical): No  Physical Activity: Inactive (07/28/2023)   Exercise Vital Sign    Days of Exercise per Week: 0 days    Minutes of Exercise per Session: Not on file  Stress: No Stress Concern Present (07/28/2023)   [employer] of Occupational Health - Occupational Stress Questionnaire    Feeling of Stress: Only a little  Social Connections: Unknown (07/28/2023)   Social Connection and Isolation Panel    Frequency of Communication with Friends and Family: Once a week    Frequency of Social Gatherings with Friends and Family: Patient declined    Attends Religious Services: Never    Database administrator or Organizations: No    Attends Engineer, structural: Not on file    Marital Status: Divorced    Tobacco Counseling Counseling given: Not Answered    Clinical Intake:  Pre-visit preparation completed: Yes  Pain : No/denies pain     BMI - recorded: 32.35 Nutritional Status: BMI > 30  Obese Nutritional Risks: None Diabetes: Yes CBG done?: No Did pt. bring in CBG monitor from home?: No  Lab Results  Component Value Date   HGBA1C 7.0 (A) 05/03/2023   HGBA1C 7.2 (H) 06/28/2022   HGBA1C 7.4 (H) 12/27/2021     How often do you need to have someone help you when you read instructions, pamphlets, or other written materials from your doctor or pharmacy?: 1 - Never  Interpreter Needed?: No  Information entered by :: Ellouise Haws, LPN   Activities of Daily Living     07/28/2023    9:57 PM  In your present state of health, do you have any difficulty performing the following activities:  Hearing? 0  Vision? 0  Difficulty concentrating or making decisions? 0  Walking or climbing stairs? 0  Dressing or bathing? 0  Doing errands, shopping? 0  Preparing Food and eating ? N   Using the Toilet? N  In the past six months, have you accidently leaked urine? N  Do you have problems with loss of bowel control? N  Managing your Medications? N  Managing your Finances? N  Housekeeping or managing your Housekeeping? N    Patient Care Team: Jodie Lavern CROME, MD as PCP - General (Family Medicine)  I have updated your Care Teams any recent Medical Services you may have received from other providers in the past year.     Assessment:   This is a routine wellness examination for Anmol.  Hearing/Vision screen Hearing Screening - Comments:: Pt denies any hearing issues  Vision Screening -  Comments:: Wears rx glasses - up to date with routine eye exams with [facility]    Goals Addressed             This Visit's Progress    Patient Stated       Maintain health and activity        Depression Screen     08/02/2023    7:58 AM 03/20/2023   11:22 AM 06/28/2022    8:59 AM 12/27/2021    8:58 AM 09/29/2021   10:37 AM  PHQ 2/9 Scores  PHQ - 2 Score 0 0 0 0 0  PHQ- 9 Score  0 0 0     Fall Risk     07/28/2023    9:57 PM 05/03/2023    8:27 AM 03/20/2023   11:19 AM 06/28/2022    8:59 AM  Fall Risk   Falls in the past year? 0 0 0 0  Number falls in past yr: 0 0 0 0  Injury with Fall? 0 0 0 0  Risk for fall due to : No Fall Risks No Fall Risks No Fall Risks No Fall Risks  Follow up Falls prevention discussed Falls evaluation completed Falls evaluation completed Falls evaluation completed;Education provided    MEDICARE RISK AT HOME:  Medicare Risk at Home Any stairs in or around the home?: (Patient-Rptd) Yes If so, are there any without handrails?: (Patient-Rptd) No Home free of loose throw rugs in walkways, pet beds, electrical cords, etc?: (Patient-Rptd) Yes Adequate lighting in your home to reduce risk of falls?: (Patient-Rptd) Yes Life alert?: (Patient-Rptd) No Use of a cane, walker or w/c?: (Patient-Rptd) No Grab bars in the bathroom?: (Patient-Rptd) No Shower  chair or bench in shower?: (Patient-Rptd) No Elevated toilet seat or a handicapped toilet?: (Patient-Rptd) No  TIMED UP AND GO:  Was the test performed?  Yes  Length of time to ambulate 10 feet: 10 sec Gait steady and fast without use of assistive device  Cognitive Function: 6CIT completed        08/02/2023    7:59 AM  6CIT Screen  What Year? 0 points  What month? 0 points  What time? 0 points  Count back from 20 0 points  Months in reverse 0 points  Repeat phrase 0 points  Total Score 0 points    Immunizations Immunization History  Administered Date(s) Administered   Influenza, High Dose Seasonal PF 01/25/2023   Janssen (J&J) SARS-COV-2 Vaccination 04/08/2019   Moderna Covid-19 Fall Seasonal Vaccine 2yrs & older 01/25/2023   PNEUMOCOCCAL CONJUGATE-20 05/03/2023   Pneumococcal Conjugate-13 11/22/2020   Zoster Recombinant(Shingrix) 02/17/2021, 04/27/2021    Screening Tests Health Maintenance  Topic Date Due   DTaP/Tdap/Td (1 - Tdap) Never done   DEXA SCAN  Never done   COVID-19 Vaccine (3 - 2024-25 season) 07/25/2023   INFLUENZA VACCINE  08/24/2023   OPHTHALMOLOGY EXAM  10/19/2023   HEMOGLOBIN A1C  11/02/2023   Diabetic kidney evaluation - eGFR measurement  05/02/2024   Diabetic kidney evaluation - Urine ACR  05/02/2024   FOOT EXAM  05/02/2024   Medicare Annual Wellness (AWV)  08/01/2024   MAMMOGRAM  03/27/2025   Colonoscopy  02/05/2031   Pneumococcal Vaccine: 50+ Years  Completed   Hepatitis C Screening  Completed   Zoster Vaccines- Shingrix  Completed   Hepatitis B Vaccines  Aged Out   HPV VACCINES  Aged Out   Meningococcal B Vaccine  Aged Out    Health Maintenance  Health Maintenance  Due  Topic Date Due   DTaP/Tdap/Td (1 - Tdap) Never done   DEXA SCAN  Never done   COVID-19 Vaccine (3 - 2024-25 season) 07/25/2023   Health Maintenance Items Addressed: See Nurse Notes at the end of this note  Additional Screening:  Vision Screening: Recommended  annual ophthalmology exams for early detection of glaucoma and other disorders of the eye. Would you like a referral to an eye doctor? No    Dental Screening: Recommended annual dental exams for proper oral hygiene  Community Resource Referral / Chronic Care Management: CRR required this visit?  No   CCM required this visit?  No   Plan:    I have personally reviewed and noted the following in the patient's chart:   Medical and social history Use of alcohol, tobacco or illicit drugs  Current medications and supplements including opioid prescriptions. Patient is not currently taking opioid prescriptions. Functional ability and status Nutritional status Physical activity Advanced directives List of other physicians Hospitalizations, surgeries, and ER visits in previous 12 months Vitals Screenings to include cognitive, depression, and falls Referrals and appointments  In addition, I have reviewed and discussed with patient certain preventive protocols, quality metrics, and best practice recommendations. A written personalized care plan for preventive services as well as general preventive health recommendations were provided to patient.   Ellouise VEAR Haws, LPN   2/89/7974   After Visit Summary: (In Person-Declined) Patient declined AVS at this time.  Notes: Nothing significant to report at this time.

## 2023-08-02 NOTE — Patient Instructions (Signed)
 Please return in 3 months for diabetes follow up   If you have any questions or concerns, please don't hesitate to send me a message via MyChart or call the office at [phone]. Thank you for visiting with us  today! It's our pleasure caring for you.    VISIT SUMMARY: Today, we discussed the management of your diabetes, a rash under your breast, jaw tightness due to bruxism, and a ganglion cyst on your tendon. We reviewed your current medications and made some adjustments to better control your blood sugar levels. We also talked about potential treatments for your other concerns.  YOUR PLAN: -TYPE 2 DIABETES MELLITUS: Type 2 diabetes is a condition where your body does not use insulin properly, leading to high blood sugar levels. We decided to add Glucotrol  XL 5 mg once daily to your medication regimen to help lower your A1c levels. Please monitor for symptoms of low blood sugar and follow the management guidelines we discussed. We will review your progress in three months.  -INTERTRIGO (SUSPECTED CANDIDA INFECTION): Intertrigo is a rash that occurs in skin folds, often due to a fungal infection like Candida. We prescribed an antifungal cream to treat the infection. Please keep the area dry and consider using cornstarch or baby powder to help with this.  -BRUXISM: Bruxism is the grinding or clenching of teeth, which can cause jaw tightness and discomfort. You are currently using a mouth guard at night. We discussed the possibility of Botox treatment to help with the jaw tightness and cheek chewing. Please consult your dentist for further evaluation.  -GANGLION CYST: A ganglion cyst is a non-cancerous lump that often develops along the tendons or joints. Your cyst is not causing any discomfort at the moment, so no treatment is needed unless it becomes symptomatic. Please monitor the cyst for any changes in size or symptoms.  INSTRUCTIONS: Please schedule a follow-up appointment in three months to  review your diabetes management. Monitor your blood sugar levels regularly and watch for any symptoms of low blood sugar. Consult your dentist regarding the possibility of Botox treatment for bruxism. Keep the area under your breast dry and use the prescribed antifungal cream as directed. Monitor your ganglion cyst for any changes.                      Contains text generated by Abridge.                                 Contains text generated by Abridge.

## 2023-08-02 NOTE — Patient Instructions (Signed)
 Emily Sims , Thank you for taking time out of your busy schedule to complete your Annual Wellness Visit with me. I enjoyed our conversation and look forward to speaking with you again next year. I, as well as your care team,  appreciate your ongoing commitment to your health goals. Please review the following plan we discussed and let me know if I can assist you in the future. Your Game plan/ To Do List    Referrals: If you haven't heard from the office you've been referred to, please reach out to them at the phone provided.   Follow up Visits: Next Medicare AWV with our clinical staff: 08/07/24   Have you seen your provider in the last 6 months (3 months if uncontrolled diabetes)? Yes Next Office Visit with your provider: 08/02/23  Clinician Recommendations:  Aim for 30 minutes of exercise or brisk walking, 6-8 glasses of water, and 5 servings of fruits and vegetables each day.       This is a list of the screening recommended for you and due dates:  Health Maintenance  Topic Date Due   Medicare Annual Wellness Visit  Never done   DTaP/Tdap/Td vaccine (1 - Tdap) Never done   DEXA scan (bone density measurement)  Never done   COVID-19 Vaccine (3 - 2024-25 season) 07/25/2023   Flu Shot  08/24/2023   Eye exam for diabetics  10/19/2023   Hemoglobin A1C  11/02/2023   Yearly kidney function blood test for diabetes  05/02/2024   Yearly kidney health urinalysis for diabetes  05/02/2024   Complete foot exam   05/02/2024   Mammogram  03/27/2025   Colon Cancer Screening  02/05/2031   Pneumococcal Vaccine for age over 34  Completed   Hepatitis C Screening  Completed   Zoster (Shingles) Vaccine  Completed   Hepatitis B Vaccine  Aged Out   HPV Vaccine  Aged Out   Meningitis B Vaccine  Aged Out    Advanced directives: (Declined) Advance directive discussed with you today. Even though you declined this today, please call our office should you change your mind, and we can give you the proper  paperwork for you to fill out. Advance Care Planning is important because it:  [x]  Makes sure you receive the medical care that is consistent with your values, goals, and preferences  [x]  It provides guidance to your family and loved ones and reduces their decisional burden about whether or not they are making the right decisions based on your wishes.  Follow the link provided in your after visit summary or read over the paperwork we have mailed to you to help you started getting your Advance Directives in place. If you need assistance in completing these, please reach out to us  so that we can help you!  See attachments for Preventive Care and Fall Prevention Tips.

## 2023-08-02 NOTE — Progress Notes (Signed)
 Subjective  CC:  Chief Complaint  Patient presents with   Diabetes    HPI: Emily Sims is a 68 y.o. female who presents to the office today for follow up of diabetes and problems listed above in the chief complaint.  Discussed the use of AI scribe software for clinical note transcription with the patient, who gave verbal consent to proceed.  History of Present Illness Emily Sims is a 68 year old female with diabetes who presents for medication management.  Diabetes follow-up: 3 months ago we stopped Glucotrol  XL and changed to metformin .  She is currently taking metformin  1000 mg per day for diabetes management. Initially, she experienced diarrhea with the medication, but after switching to a different formulation, her symptoms improved. She also takes Jardiance  25 mg daily, which she tolerates well. She has a history of using Glucotrol . She has not experienced issues with hypoglycemia in the past while on Glucotrol  and has a blood sugar meter at home to monitor her levels.  She is working on improving her diet.  Her microalbuminuria testing was negative.  She is not on an ACE nor ARB and she has excellent blood pressure.  She is normotensive again today.  She has a ganglion cyst on her tendon, which does not currently cause discomfort. She notes that it can fluctuate in size.  She describes a rash under her skin, suspected to be a fungal infection. She has been using a cream that has helped reduce the redness, but the rash persists, especially in hot weather. She tries to keep the area dry.  She experiences tightness in her jaws due to bruxism, for which she uses a mouth guard at night. She is interested in exploring Botox treatment for her jaw muscles to alleviate the constant urge to chew the sides of her cheeks, particularly during the day when she is driving.    Wt Readings from Last 3 Encounters:  08/02/23 190 lb (86.2 kg)  08/02/23 191 lb 6.4 oz (86.8 kg)  05/03/23 190  lb (86.2 kg)    BP Readings from Last 3 Encounters:  08/02/23 105/72  08/02/23 118/68  05/03/23 123/76    Assessment  1. Uncontrolled type 2 diabetes mellitus with hyperglycemia (HCC)   2. Obesity (BMI 30-39.9)   3. Hyperlipidemia associated with type 2 diabetes mellitus (HCC)   4. Ganglion cyst   5. Candidal intertrigo   6. Microscopic hematuria      Plan  Assessment and Plan Assessment & Plan Type 2 Diabetes Mellitus A1c near target but not below 7%. Discussed increasing metformin , adding Glucotrol , or starting GLP-1 agonists. Patient prefers Glucotrol  due to familiarity. Glucotrol  may lower A1c but requires hypoglycemia monitoring. - Prescribe Glucotrol  XL 5 mg once daily.  Continue Jardiance  and metformin  XR - Monitor for symptoms of hypoglycemia and educate on management. - Schedule follow-up appointment in three months.  Intertrigo (suspected Candida infection) Rash under breast likely Candida. - Prescribe nystatin  powder for suspected Candida infection. - Advise keeping the area dry, possibly using cornstarch or baby powder.  Bruxism Jaw tightness and cheek chewing likely due to bruxism. Mouth guard used at night. Discussed Botox treatment inquiry. - Consult dentist regarding Botox treatment for bruxism.  Masseter treatment  Ganglion Cyst Asymptomatic ganglion cyst on tendon. No treatment unless symptomatic. - Monitor cyst for changes in size or symptoms.  Hyperlipidemia is at goal on statin  History of microscopic hematuria: Next visit need to repeat urine studies.  Patient no longer  has urinary symptoms.  Follow up: 3 months to recheck diabetes and urine Orders Placed This Encounter  Procedures   POCT HgB A1C   Meds ordered this encounter  Medications   glipiZIDE  (GLUCOTROL  XL) 5 MG 24 hr tablet    Sig: Take 1 tablet (5 mg total) by mouth daily with breakfast.    Dispense:  90 tablet    Refill:  3   nystatin  (MYCOSTATIN /NYSTOP ) powder    Sig: Apply 1  Application topically 3 (three) times daily.    Dispense:  30 g    Refill:  0      Immunization History  Administered Date(s) Administered   Influenza, High Dose Seasonal PF 01/25/2023   Janssen (J&J) SARS-COV-2 Vaccination 04/08/2019   Moderna Covid-19 Fall Seasonal Vaccine 4yrs & older 01/25/2023   PNEUMOCOCCAL CONJUGATE-20 05/03/2023   Pneumococcal Conjugate-13 11/22/2020   Zoster Recombinant(Shingrix) 02/17/2021, 04/27/2021    Diabetes Related Lab Review: Lab Results  Component Value Date   HGBA1C 7.2 (A) 08/02/2023   HGBA1C 7.0 (A) 05/03/2023   HGBA1C 7.2 (H) 06/28/2022    Lab Results  Component Value Date   MICROALBUR <0.7 05/03/2023   Lab Results  Component Value Date   CREATININE 0.79 05/03/2023   BUN 18 05/03/2023   NA 138 05/03/2023   K 4.0 05/03/2023   CL 101 05/03/2023   CO2 25 05/03/2023   Lab Results  Component Value Date   CHOL 145 05/03/2023   CHOL 165 12/27/2021   Lab Results  Component Value Date   HDL 50.80 05/03/2023   HDL 56.20 12/27/2021   Lab Results  Component Value Date   LDLCALC 60 05/03/2023   Lab Results  Component Value Date   TRIG 169.0 (H) 05/03/2023   TRIG 213.0 (H) 12/27/2021   Lab Results  Component Value Date   CHOLHDL 3 05/03/2023   CHOLHDL 3 12/27/2021   Lab Results  Component Value Date   LDLDIRECT 78.0 12/27/2021   The 10-year ASCVD risk score (Arnett DK, et al., 2019) is: 9%   Values used to calculate the score:     Age: 68 years     Clincally relevant sex: Female     Is Non-Hispanic African American: No     Diabetic: Yes     Tobacco smoker: No     Systolic Blood Pressure: 105 mmHg     Is BP treated: No     HDL Cholesterol: 50.8 mg/dL     Total Cholesterol: 145 mg/dL I have reviewed the PMH, Fam and Soc history. Patient Active Problem List   Diagnosis Date Noted   Obesity (BMI 30-39.9) 05/03/2023    Priority: High   Uncontrolled type 2 diabetes mellitus with hyperglycemia (HCC) 09/29/2021     Priority: High     intolerance with Metformin , trial of metformin  xr 04/2023; jardiance  and glucotrol  xl. Defers GLP-1, pt preference    Hyperlipidemia associated with type 2 diabetes mellitus (HCC) 09/29/2021    Priority: High    Lipitor 20    Moderate recurrent major depression (HCC) 09/29/2021    Priority: High   Overactive bladder 05/03/2023    Priority: Medium    History of peptic ulcer 05/03/2023    Priority: Medium     History of nsaid induced ulcers; EGD. Neg H.pylori, neg HH    GERD (gastroesophageal reflux disease) 09/29/2021    Priority: Medium     Prilosec chronic, high dose per GI, [place]  History of nsaid induced ulcers; EGD. Neg H.pylori,  neg HH    Microscopic hematuria 05/14/2023    April 2025; needs repeat and repeat culture. To urology if persists     Social History: Patient  reports that she has never smoked. She has never been exposed to tobacco smoke. She has never used smokeless tobacco. She reports that she does not drink alcohol and does not use drugs.  Review of Systems: Ophthalmic: negative for eye pain, loss of vision or double vision Cardiovascular: negative for chest pain Respiratory: negative for SOB or persistent cough Gastrointestinal: negative for abdominal pain Genitourinary: negative for dysuria or gross hematuria MSK: negative for foot lesions Neurologic: negative for weakness or gait disturbance  Objective  Vitals: BP 105/72   Pulse 62   Temp 97.7 F (36.5 C)   Ht 5' 4.5 (1.638 m)   Wt 190 lb (86.2 kg)   LMP  (LMP Unknown)   SpO2 94%   BMI 32.11 kg/m  General: well appearing, no acute distress  Psych:  Alert and oriented, normal mood and affect Right volar wrist with small ganglion cyst  Diabetic education: ongoing education regarding chronic disease management for diabetes was given today. We continue to reinforce the ABC's of diabetic management: A1c (<7 or 8 dependent upon patient), tight blood pressure control, and cholesterol  management with goal LDL < 100 minimally. We discuss diet strategies, exercise recommendations, medication options and possible side effects. At each visit, we review recommended immunizations and preventive care recommendations for diabetics and stress that good diabetic control can prevent other problems. See below for this patient's data. Commons side effects, risks, benefits, and alternatives for medications and treatment plan prescribed today were discussed, and the patient expressed understanding of the given instructions. Patient is instructed to call or message via MyChart if he/she has any questions or concerns regarding our treatment plan. No barriers to understanding were identified. We discussed Red Flag symptoms and signs in detail. Patient expressed understanding regarding what to do in case of urgent or emergency type symptoms.  Medication list was reconciled, printed and provided to the patient in AVS. Patient instructions and summary information was reviewed with the patient as documented in the AVS. This note was prepared with assistance of Dragon voice recognition software. Occasional wrong-word or sound-a-like substitutions may have occurred due to the inherent limitations of voice recognition software

## 2023-08-15 DIAGNOSIS — H04122 Dry eye syndrome of left lacrimal gland: Secondary | ICD-10-CM | POA: Diagnosis not present

## 2023-08-15 DIAGNOSIS — E119 Type 2 diabetes mellitus without complications: Secondary | ICD-10-CM | POA: Diagnosis not present

## 2023-10-13 ENCOUNTER — Other Ambulatory Visit: Payer: Self-pay | Admitting: Family

## 2023-10-13 DIAGNOSIS — E1169 Type 2 diabetes mellitus with other specified complication: Secondary | ICD-10-CM

## 2023-11-02 ENCOUNTER — Ambulatory Visit (INDEPENDENT_AMBULATORY_CARE_PROVIDER_SITE_OTHER): Admitting: Family Medicine

## 2023-11-02 ENCOUNTER — Encounter: Payer: Self-pay | Admitting: Family Medicine

## 2023-11-02 VITALS — BP 106/70 | HR 67 | Temp 97.3°F | Ht 64.5 in | Wt 195.0 lb

## 2023-11-02 DIAGNOSIS — E669 Obesity, unspecified: Secondary | ICD-10-CM | POA: Diagnosis not present

## 2023-11-02 DIAGNOSIS — Z23 Encounter for immunization: Secondary | ICD-10-CM

## 2023-11-02 DIAGNOSIS — Z7984 Long term (current) use of oral hypoglycemic drugs: Secondary | ICD-10-CM

## 2023-11-02 DIAGNOSIS — E65 Localized adiposity: Secondary | ICD-10-CM

## 2023-11-02 DIAGNOSIS — E119 Type 2 diabetes mellitus without complications: Secondary | ICD-10-CM | POA: Diagnosis not present

## 2023-11-02 LAB — POCT GLYCOSYLATED HEMOGLOBIN (HGB A1C): Hemoglobin A1C: 6.8 % — AB (ref 4.0–5.6)

## 2023-11-02 MED ORDER — TIRZEPATIDE 2.5 MG/0.5ML ~~LOC~~ SOAJ: 2.5000 mg | SUBCUTANEOUS | 2 refills | Status: DC

## 2023-11-02 NOTE — Patient Instructions (Signed)
 Please return in 3 months for diabetes follow up   If you have any questions or concerns, please don't hesitate to send me a message via MyChart or call the office at [phone]. Thank you for visiting with us  today! It's our pleasure caring for you.    VISIT SUMMARY: Today, we discussed your diabetes management and made some changes to your medication. We also talked about starting a new medication to help with weight loss and better control of your diabetes. Additionally, we administered your flu shot.  YOUR PLAN: -TYPE 2 DIABETES MELLITUS: Type 2 Diabetes Mellitus is a condition where your body does not use insulin properly, leading to high blood sugar levels. Your diabetes is well-controlled with an A1c of 6.8%. We will transition you to Mounjaro, a new medication that can help with both diabetes management and weight loss. You will start with a 2.5 mg weekly injection of Mounjaro and discontinue glipizide . Continue taking metformin  and Jardiance . Be aware of potential side effects like nausea and constipation, and start at a low dose to minimize these effects. Increase your protein intake and engage in strength training to prevent muscle loss. We will monitor your weight loss and how you tolerate Mounjaro. Please schedule a follow-up in 3 months to reassess your diabetes management.  -IMMUNIZATION: We discussed the importance of routine immunizations and administered your flu shot today.  INSTRUCTIONS: Please schedule a follow-up appointment in 3 months to reassess your diabetes management and monitor your progress with Mounjaro.                      Contains text generated by [employer]bridge.                                 Contains text generated by Abridge.

## 2023-11-02 NOTE — Progress Notes (Signed)
 Subjective  CC:  Chief Complaint  Patient presents with   Diabetes    HPI: Emily Sims is a 68 y.o. female who presents to the office today for follow up of diabetes and problems listed above in the chief complaint.  Discussed the use of AI scribe software for clinical note transcription with the patient, who gave verbal consent to proceed.  History of Present Illness Emily Sims is a 68 year old female with diabetes who presents for medication management and follow-up.  Diabetes - Diabetes managed with glipizide , metformin , and Jardiance  - Hemoglobin A1c is 6.8%, improved and now controlled.  - no side effects. Feels well.  - Home blood glucose monitoring performed occasionally, but not daily  Interest in glp-1 receptor agonist therapy - Interested in initiating GLP-1 receptor agonists such as Mounjaro or Ozempic for weight loss and improved diabetes control - Positive outcomes observed in acquaintances regarding weight loss and glycemic management - Insurance coverage for these medications has been confirmed  Remains normotensive Eligible for flu shot today.    Wt Readings from Last 3 Encounters:  11/02/23 195 lb (88.5 kg)  08/02/23 190 lb (86.2 kg)  08/02/23 191 lb 6.4 oz (86.8 kg)    BP Readings from Last 3 Encounters:  11/02/23 106/70  08/02/23 105/72  08/02/23 118/68    Assessment  1. Controlled type 2 diabetes mellitus without complication, without long-term current use of insulin (HCC)   2. Need for immunization against influenza   3. Diabetes mellitus treated with oral medication (HCC)   4. Obesity (BMI 30-39.9)   5. Central obesity      Plan  Assessment and Plan Assessment & Plan Type 2 Diabetes Mellitus Type 2 Diabetes Mellitus is well-controlled with an A1c of 6.8%. - Transition to Mounjaro, a GLP-1 receptor agonist, due to its benefits in diabetic management, weight loss, and metabolic function. - Pat is preferred over Ozempic due  to a better GI side effect profile and insurance coverage. - Discontinue glipizide  upon initiation of Mounjaro. - Initiate Mounjaro at 2.5 mg weekly injection. - Continue metformin  and Jardiance . - Discussed potential side effects including nausea and constipation, and the importance of starting at a low dose to minimize these effects. - Emphasized the need for increased protein intake and strength training to prevent muscle loss. - Monitor weight loss and tolerance to Mounjaro. - Schedule follow-up in 3 months to reassess diabetes management.  Immunization Discussion about receiving a flu shot as part of routine immunization. - Administer flu shot.    Follow up: 3 mo Orders Placed This Encounter  Procedures   Flu vaccine HIGH DOSE PF(Fluzone Trivalent)   POCT glycosylated hemoglobin (Hb A1C)   Meds ordered this encounter  Medications   tirzepatide (MOUNJARO) 2.5 MG/0.5ML Pen    Sig: Inject 2.5 mg into the skin once a week.    Dispense:  2 mL    Refill:  2      Immunization History  Administered Date(s) Administered   INFLUENZA, HIGH DOSE SEASONAL PF 01/25/2023, 11/02/2023   Janssen (J&J) SARS-COV-2 Vaccination 04/08/2019   Moderna Covid-19 Fall Seasonal Vaccine 21yrs & older 01/25/2023   PNEUMOCOCCAL CONJUGATE-20 05/03/2023   Pneumococcal Conjugate-13 11/22/2020   Zoster Recombinant(Shingrix) 02/17/2021, 04/27/2021    Diabetes Related Lab Review: Lab Results  Component Value Date   HGBA1C 6.8 (A) 11/02/2023   HGBA1C 7.2 (A) 08/02/2023   HGBA1C 7.0 (A) 05/03/2023    Lab Results  Component Value Date  MICROALBUR <0.7 05/03/2023   Lab Results  Component Value Date   CREATININE 0.79 05/03/2023   BUN 18 05/03/2023   NA 138 05/03/2023   K 4.0 05/03/2023   CL 101 05/03/2023   CO2 25 05/03/2023   Lab Results  Component Value Date   CHOL 145 05/03/2023   CHOL 165 12/27/2021   Lab Results  Component Value Date   HDL 50.80 05/03/2023   HDL 56.20 12/27/2021    Lab Results  Component Value Date   LDLCALC 60 05/03/2023   Lab Results  Component Value Date   TRIG 169.0 (H) 05/03/2023   TRIG 213.0 (H) 12/27/2021   Lab Results  Component Value Date   CHOLHDL 3 05/03/2023   CHOLHDL 3 12/27/2021   Lab Results  Component Value Date   LDLDIRECT 78.0 12/27/2021   The 10-year ASCVD risk score (Arnett DK, et al., 2019) is: 10.3%   Values used to calculate the score:     Age: 52 years     Clincally relevant sex: Female     Is Non-Hispanic African American: No     Diabetic: Yes     Tobacco smoker: No     Systolic Blood Pressure: 106 mmHg     Is BP treated: No     HDL Cholesterol: 50.8 mg/dL     Total Cholesterol: 145 mg/dL I have reviewed the PMH, Fam and Soc history. Patient Active Problem List   Diagnosis Date Noted   Obesity (BMI 30-39.9) 05/03/2023    Priority: High   Uncontrolled type 2 diabetes mellitus with hyperglycemia (HCC) 09/29/2021    Priority: High     intolerance with Metformin , trial of metformin  xr 04/2023; jardiance  and glucotrol  xl. Defers GLP-1, pt preference    Hyperlipidemia associated with type 2 diabetes mellitus (HCC) 09/29/2021    Priority: High    Lipitor 20    Moderate recurrent major depression (HCC) 09/29/2021    Priority: High   Overactive bladder 05/03/2023    Priority: Medium    History of peptic ulcer 05/03/2023    Priority: Medium     History of nsaid induced ulcers; EGD. Neg H.pylori, neg HH    GERD (gastroesophageal reflux disease) 09/29/2021    Priority: Medium     Prilosec chronic, high dose per GI, [place]  History of nsaid induced ulcers; EGD. Neg H.pylori, neg HH    Microscopic hematuria 05/14/2023    April 2025; needs repeat and repeat culture. To urology if persists     Social History: Patient  reports that she has never smoked. She has never been exposed to tobacco smoke. She has never used smokeless tobacco. She reports that she does not drink alcohol and does not use  drugs.  Review of Systems: Ophthalmic: negative for eye pain, loss of vision or double vision Cardiovascular: negative for chest pain Respiratory: negative for SOB or persistent cough Gastrointestinal: negative for abdominal pain Genitourinary: negative for dysuria or gross hematuria MSK: negative for foot lesions Neurologic: negative for weakness or gait disturbance  Objective  Vitals: BP 106/70 (BP Location: Left Arm, Patient Position: Sitting, Cuff Size: Normal)   Pulse 67   Temp (!) 97.3 F (36.3 C) (Temporal)   Ht 5' 4.5 (1.638 m)   Wt 195 lb (88.5 kg)   LMP  (LMP Unknown)   SpO2 97%   BMI 32.95 kg/m  General: well appearing, no acute distress  Psych:  Alert and oriented, normal mood and affect HEENT:  Normocephalic, atraumatic, moist mucous  membranes, supple neck  Cardiovascular:  Nl S1 and S2, RRR without murmur, gallop or rub. no edema Respiratory:  Good breath sounds bilaterally, CTAB with normal effort, no rales   Diabetic education: ongoing education regarding chronic disease management for diabetes was given today. We continue to reinforce the ABC's of diabetic management: A1c (<7 or 8 dependent upon patient), tight blood pressure control, and cholesterol management with goal LDL < 100 minimally. We discuss diet strategies, exercise recommendations, medication options and possible side effects. At each visit, we review recommended immunizations and preventive care recommendations for diabetics and stress that good diabetic control can prevent other problems. See below for this patient's data. Commons side effects, risks, benefits, and alternatives for medications and treatment plan prescribed today were discussed, and the patient expressed understanding of the given instructions. Patient is instructed to call or message via MyChart if he/she has any questions or concerns regarding our treatment plan. No barriers to understanding were identified. We discussed Red Flag  symptoms and signs in detail. Patient expressed understanding regarding what to do in case of urgent or emergency type symptoms.  Medication list was reconciled, printed and provided to the patient in AVS. Patient instructions and summary information was reviewed with the patient as documented in the AVS. This note was prepared with assistance of Dragon voice recognition software. Occasional wrong-word or sound-a-like substitutions may have occurred due to the inherent limitations of voice recognition software

## 2023-11-07 ENCOUNTER — Telehealth: Payer: Self-pay

## 2023-11-07 ENCOUNTER — Other Ambulatory Visit (HOSPITAL_COMMUNITY): Payer: Self-pay

## 2023-11-07 NOTE — Telephone Encounter (Signed)
 Pharmacy Patient Advocate Encounter  Received notification from [facility] [employer] that Prior Authorization for Mounjaro 2.5MG /0.5ML auto-injectors  has been APPROVED from 11/07/23 to 11/06/24. Ran test claim, Copay is $0. This test claim was processed through [facility]- copay amounts may vary at other pharmacies due to pharmacy/plan contracts, or as the patient moves through the different stages of their insurance plan.   PA #/Case ID/Reference #: [id number]

## 2023-11-14 ENCOUNTER — Other Ambulatory Visit: Payer: Self-pay | Admitting: Family Medicine

## 2023-11-14 DIAGNOSIS — K219 Gastro-esophageal reflux disease without esophagitis: Secondary | ICD-10-CM

## 2023-11-16 ENCOUNTER — Other Ambulatory Visit: Payer: PPO

## 2023-11-16 ENCOUNTER — Emergency Department (HOSPITAL_COMMUNITY)
Admission: EM | Admit: 2023-11-16 | Discharge: 2023-11-17 | Discharge status: Against Medical Advice | Attending: Emergency Medicine | Admitting: Emergency Medicine

## 2023-11-16 ENCOUNTER — Emergency Department (HOSPITAL_COMMUNITY)

## 2023-11-16 DIAGNOSIS — K5900 Constipation, unspecified: Secondary | ICD-10-CM | POA: Insufficient documentation

## 2023-11-16 DIAGNOSIS — Z5321 Procedure and treatment not carried out due to patient leaving prior to being seen by health care provider: Secondary | ICD-10-CM | POA: Insufficient documentation

## 2023-11-16 DIAGNOSIS — K6289 Other specified diseases of anus and rectum: Secondary | ICD-10-CM | POA: Diagnosis present

## 2023-11-16 LAB — COMPREHENSIVE METABOLIC PANEL WITH GFR
ALT: 25 U/L (ref 0–44)
AST: 27 U/L (ref 15–41)
Albumin: 3.9 g/dL (ref 3.5–5.0)
Alkaline Phosphatase: 60 U/L (ref 38–126)
Anion gap: 16 — ABNORMAL HIGH (ref 5–15)
BUN: 17 mg/dL (ref 8–23)
CO2: 24 mmol/L (ref 22–32)
Calcium: 8.9 mg/dL (ref 8.9–10.3)
Chloride: 98 mmol/L (ref 98–111)
Creatinine, Ser: 1.03 mg/dL — ABNORMAL HIGH (ref 0.44–1.00)
GFR, Estimated: 59 mL/min — ABNORMAL LOW (ref >60)
Glucose, Bld: 152 mg/dL — ABNORMAL HIGH (ref 70–99)
Potassium: 3.6 mmol/L (ref 3.5–5.1)
Sodium: 138 mmol/L (ref 135–145)
Total Bilirubin: 0.8 mg/dL (ref 0.0–1.2)
Total Protein: 7.4 g/dL (ref 6.5–8.1)

## 2023-11-16 LAB — CBC
HCT: 47.6 % — ABNORMAL HIGH (ref 36.0–46.0)
Hemoglobin: 15.7 g/dL — ABNORMAL HIGH (ref 12.0–15.0)
MCH: 30.5 pg (ref 26.0–34.0)
MCHC: 33 g/dL (ref 30.0–36.0)
MCV: 92.4 fL (ref 80.0–100.0)
Platelets: 261 K/uL (ref 150–400)
RBC: 5.15 MIL/uL — ABNORMAL HIGH (ref 3.87–5.11)
RDW: 12.9 % (ref 11.5–15.5)
WBC: 13.1 K/uL — ABNORMAL HIGH (ref 4.0–10.5)
nRBC: 0 % (ref 0.0–0.2)

## 2023-11-16 LAB — LIPASE, BLOOD: Lipase: 19 U/L (ref 11–51)

## 2023-11-16 NOTE — ED Triage Notes (Signed)
 Pt complains of rectal pain and constipation. Pt has taken multiple suppositories, miralax chewables and tried to do an enema but felt stool at the tip of rectum and could not get it out. Denies nausea or vomiting. Also feels that make be dehydrated due to lips being dry.

## 2023-11-16 NOTE — ED Triage Notes (Signed)
 First Nurse Note: Pt has been constipation since 10/21. She now believes she has a fecal impaction and is having severe rectal pain without bleeding.

## 2023-11-17 NOTE — ED Notes (Signed)
Pt stated she was leaving.  

## 2023-11-27 ENCOUNTER — Other Ambulatory Visit: Payer: Self-pay | Admitting: Family Medicine

## 2023-11-27 DIAGNOSIS — E1169 Type 2 diabetes mellitus with other specified complication: Secondary | ICD-10-CM

## 2023-12-25 ENCOUNTER — Other Ambulatory Visit: Payer: Self-pay | Admitting: Family

## 2023-12-25 DIAGNOSIS — F331 Major depressive disorder, recurrent, moderate: Secondary | ICD-10-CM

## 2023-12-27 ENCOUNTER — Ambulatory Visit (HOSPITAL_BASED_OUTPATIENT_CLINIC_OR_DEPARTMENT_OTHER)
Admission: RE | Admit: 2023-12-27 | Discharge: 2023-12-27 | Disposition: A | Source: Ambulatory Visit | Attending: Family Medicine | Admitting: Family Medicine

## 2023-12-27 DIAGNOSIS — Z780 Asymptomatic menopausal state: Secondary | ICD-10-CM | POA: Diagnosis present

## 2023-12-27 DIAGNOSIS — M85851 Other specified disorders of bone density and structure, right thigh: Secondary | ICD-10-CM | POA: Diagnosis not present

## 2023-12-28 ENCOUNTER — Encounter: Payer: Self-pay | Admitting: Family Medicine

## 2023-12-28 DIAGNOSIS — E1169 Type 2 diabetes mellitus with other specified complication: Secondary | ICD-10-CM

## 2023-12-28 DIAGNOSIS — F331 Major depressive disorder, recurrent, moderate: Secondary | ICD-10-CM

## 2023-12-28 MED ORDER — FLUOXETINE HCL 40 MG PO CAPS: 40.0000 mg | ORAL_CAPSULE | Freq: Every day | ORAL | 3 refills | Status: AC

## 2023-12-30 ENCOUNTER — Ambulatory Visit: Payer: Self-pay | Admitting: Family Medicine

## 2023-12-30 DIAGNOSIS — Z780 Asymptomatic menopausal state: Secondary | ICD-10-CM | POA: Insufficient documentation

## 2023-12-30 NOTE — Progress Notes (Signed)
 See mychart note.  Dexa 12/2023: Lowest T = -1.3 fem neck: FRAX 14 and 2; osteopenia; recheck 2 years. Routing guidance.  Emily Sims, Thank you for getting your bone density screening test done. I have reviewed the results. Your results show mild low bone mass in the range of osteopenia. Exercising and consuming vit D and calcium  in food or supplements is recommended and we can recheck in 2 years.   Sincerely, Dr. Jodie

## 2023-12-31 ENCOUNTER — Encounter: Payer: Self-pay | Admitting: Family Medicine

## 2024-01-01 MED ORDER — TIRZEPATIDE 5 MG/0.5ML ~~LOC~~ SOAJ: 5.0000 mg | SUBCUTANEOUS | 2 refills | Status: DC

## 2024-02-04 ENCOUNTER — Ambulatory Visit: Admitting: Family Medicine

## 2024-02-08 ENCOUNTER — Ambulatory Visit: Admitting: Family Medicine

## 2024-02-13 ENCOUNTER — Ambulatory Visit: Admitting: Family Medicine

## 2024-02-13 ENCOUNTER — Encounter: Payer: Self-pay | Admitting: Family Medicine

## 2024-02-13 VITALS — BP 118/82 | HR 70 | Temp 98.0°F | Resp 12 | Ht 64.0 in | Wt 182.8 lb

## 2024-02-13 DIAGNOSIS — R3129 Other microscopic hematuria: Secondary | ICD-10-CM

## 2024-02-13 DIAGNOSIS — E119 Type 2 diabetes mellitus without complications: Secondary | ICD-10-CM | POA: Diagnosis not present

## 2024-02-13 DIAGNOSIS — E669 Obesity, unspecified: Secondary | ICD-10-CM

## 2024-02-13 DIAGNOSIS — G43009 Migraine without aura, not intractable, without status migrainosus: Secondary | ICD-10-CM

## 2024-02-13 DIAGNOSIS — E785 Hyperlipidemia, unspecified: Secondary | ICD-10-CM | POA: Diagnosis not present

## 2024-02-13 DIAGNOSIS — E1169 Type 2 diabetes mellitus with other specified complication: Secondary | ICD-10-CM

## 2024-02-13 DIAGNOSIS — K219 Gastro-esophageal reflux disease without esophagitis: Secondary | ICD-10-CM

## 2024-02-13 DIAGNOSIS — Z7984 Long term (current) use of oral hypoglycemic drugs: Secondary | ICD-10-CM | POA: Diagnosis not present

## 2024-02-13 DIAGNOSIS — G43909 Migraine, unspecified, not intractable, without status migrainosus: Secondary | ICD-10-CM | POA: Insufficient documentation

## 2024-02-13 LAB — CBC
HCT: 45.2 % (ref 36.0–46.0)
Hemoglobin: 15.2 g/dL — ABNORMAL HIGH (ref 12.0–15.0)
MCHC: 33.5 g/dL (ref 30.0–36.0)
MCV: 92.2 fl (ref 78.0–100.0)
Platelets: 235 K/uL (ref 150.0–400.0)
RBC: 4.9 Mil/uL (ref 3.87–5.11)
RDW: 13.6 % (ref 11.5–15.5)
WBC: 5.8 K/uL (ref 4.0–10.5)

## 2024-02-13 LAB — COMPLETE METABOLIC PANEL WITHOUT GFR
AG Ratio: 1.8 (calc) (ref 1.0–2.5)
ALT: 20 U/L (ref 6–29)
AST: 17 U/L (ref 10–35)
Albumin: 4.2 g/dL (ref 3.6–5.1)
Alkaline phosphatase (APISO): 70 U/L (ref 37–153)
BUN: 16 mg/dL (ref 7–25)
CO2: 30 mmol/L (ref 20–32)
Calcium: 9.5 mg/dL (ref 8.6–10.4)
Chloride: 102 mmol/L (ref 98–110)
Creat: 0.78 mg/dL (ref 0.50–1.05)
Globulin: 2.4 g/dL (ref 1.9–3.7)
Glucose, Bld: 118 mg/dL — ABNORMAL HIGH (ref 65–99)
Potassium: 4.5 mmol/L (ref 3.5–5.3)
Sodium: 139 mmol/L (ref 135–146)
Total Bilirubin: 0.6 mg/dL (ref 0.2–1.2)
Total Protein: 6.6 g/dL (ref 6.1–8.1)

## 2024-02-13 LAB — URINALYSIS, MICROSCOPIC ONLY

## 2024-02-13 LAB — LIPID PANEL
Cholesterol: 135 mg/dL (ref 28–200)
HDL: 48.2 mg/dL
LDL Cholesterol: 43 mg/dL (ref 10–99)
NonHDL: 86.3
Total CHOL/HDL Ratio: 3
Triglycerides: 216 mg/dL — ABNORMAL HIGH (ref 10.0–149.0)
VLDL: 43.2 mg/dL — ABNORMAL HIGH (ref 0.0–40.0)

## 2024-02-13 LAB — MICROALBUMIN / CREATININE URINE RATIO
Creatinine,U: 84.1 mg/dL
Microalb Creat Ratio: UNDETERMINED mg/g (ref 0.0–30.0)
Microalb, Ur: <0.7 mg/dL

## 2024-02-13 LAB — HEMOGLOBIN A1C: Hgb A1c MFr Bld: 6.5 % (ref 4.6–6.5)

## 2024-02-13 MED ORDER — SEMAGLUTIDE(0.25 OR 0.5MG/DOS) 2 MG/3ML ~~LOC~~ SOPN: 0.2500 mg | PEN_INJECTOR | SUBCUTANEOUS | 2 refills | Status: AC

## 2024-02-13 MED ORDER — SUMATRIPTAN SUCCINATE 25 MG PO TABS: 25.0000 mg | ORAL_TABLET | ORAL | 1 refills | Status: AC | PRN

## 2024-02-13 NOTE — Patient Instructions (Signed)
 Please return in 3 months for diabetes follow up   I will release your lab results to you on your MyChart account with further instructions. You may see the results before I do, but when I review them I will send you a message with my report or have my assistant call you if things need to be discussed. Please reply to my message with any questions. Thank you!   If you have any questions or concerns, please don't hesitate to send me a message via MyChart or call the office at [phone]. Thank you for visiting with us  today! It's our pleasure caring for you.    VISIT SUMMARY: Today, we discussed your type 2 diabetes management, migraine headaches, and peripheral sensory symptoms. We reviewed your intolerance to Mounjaro  and considered an alternative medication. We also addressed your migraine management and noted your cold feet.  YOUR PLAN: -TYPE 2 DIABETES MELLITUS: Type 2 diabetes is a condition where your body does not use insulin properly, leading to high blood sugar levels. You experienced side effects from Mounjaro , so we have discontinued it and will try Ozempic  at a low dose to see if you tolerate it better. We discussed potential side effects and how to minimize them. Your A1c level will be checked today, and you should monitor your blood glucose levels in the morning. We will follow up in three months to see how you are doing.  -MIGRAINE HEADACHES: Migraines are severe headaches that can cause intense pain and other symptoms. Your migraines occur infrequently and are managed well with medication. We have refilled your prescription and added migraines to your problem list.  INSTRUCTIONS: Please start taking Ozempic  0.25 mg once every ten days and monitor your blood glucose levels in the morning. We will check your A1c level today. Follow up in three months for your diabetes management. Continue taking your migraine medication as needed.    Contains text generated by [employer].

## 2024-02-13 NOTE — Progress Notes (Addendum)
 "  Subjective  CC:  Chief Complaint  Patient presents with   Follow-up    Expressed that she is doing okay. Stopped Mounjaro .    Diabetes    HPI: Emily Sims is a 69 y.o. female who presents to the office today for follow up of diabetes and problems listed above in the chief complaint.  Discussed the use of AI scribe software for clinical note transcription with the patient, who gave verbal consent to proceed.  History of Present Illness [name] is a 69 year old female with type 2 diabetes who presents for follow-up of diabetes and obesity with intolerance to Mounjaro  due to side effects.  Type 2 diabetes mellitus and antidiabetic medication intolerance, takes metformin  and Jardiance .  Stop Mounjaro  5 mg about 2 weeks ago. - Intolerance to Mounjaro  due to persistent sulfur burps and vomiting, which began at initiation and continued throughout 11 weeks of use. - Discontinued Mounjaro  two weeks ago due to intolerable side effects. - Experienced weight loss during Mounjaro  therapy. - Interested in current A1c level, which will be checked today. - Considering trial of Ozempic  after discontinuing Mounjaro ; aware of family member's experience with both medications. She has lost over 10 pounds. Type 2 diabetes associate with diabetes as noted above.  Medications are working.  Recheck lipids.  See below. History of microscopic hematuria.  No gross hematuria.  No urinary symptoms today.  Needs follow-up.  Migraine headaches - Migraines occur infrequently.  1 to 3/year.  Mild.  No red flag symptoms. - Managed with medication that is effective.  Imitrex  - No evaluation by neurology.  Hyperlipidemia on statin fasting for recheck today.  Hyperlipidemia is associated with her diabetes.  She is working on diet for both  GERD managed with omeprazole  40 mg daily.  Did not tolerate Mounjaro .    Wt Readings from Last 3 Encounters:  02/13/24 182 lb 12.8 oz (82.9 kg)  11/16/23 194 lb  0.1 oz (88 kg)  11/02/23 195 lb (88.5 kg)    BP Readings from Last 3 Encounters:  02/13/24 118/82  11/17/23 116/77  11/02/23 106/70    Assessment  1. Type 2 diabetes mellitus with hyperlipidemia (HCC)   2. Diabetes mellitus treated with oral medication (HCC)   3. Hyperlipidemia associated with type 2 diabetes mellitus (HCC)   4. Obesity (BMI 30-39.9)   5. Gastroesophageal reflux disease without esophagitis   6. Migraine without aura and without status migrainosus, not intractable   7. Microscopic hematuria      Plan  Assessment and Plan Assessment & Plan Type 2 diabetes mellitus Previously managed with Mounjaro , discontinued due to intolerable side effects including sulfur burps and vomiting. A1c results pending. Considering Ozempic  as an alternative, with a low starting dose to assess tolerance. Discussed potential gastrointestinal side effects and alternative injection sites to minimize these effects. She expressed interest in trying Ozempic  despite previous negative experiences with similar medications. - Ordered A1c test - Prescribed Ozempic  0.25 mg, instructed to start once every ten days to see if she will tolerate it.  At risk due to GERD requiring high-dose omeprazole .  Continue metformin  and Jardiance .  Continue diabetic diet.  No complications. - Instructed to monitor blood glucose levels in the morning - Scheduled follow-up diabetes visit in three months - Urine nephropathy screening today.  She is not on an ACE inhibitor.  She is on Jardiance .  Obesity: Improving.  BMI down to 31.3.  Trial of Ozempic  and healthy diet.  Recommend  exercise  Hyperlipidemia on statin for recheck today.  LFTs and lipid panel ordered.  She is fasting.  She tolerates it well. Monitor blood pressures.  Has been normotensive.  Diastolic minimally elevated today at 82  Migraine Intermittent migraines, previously managed with medication. No recent neurologist consultation required. - Refilled  migraine medication, Imitrex  - Added migraine to problem list  History of microscopic hematuria: Recheck urine microscopic exam today.  To urology if persists  Follow up: 3 months for diabetes recheck Orders Placed This Encounter  Procedures   Hemoglobin A1c   Microalbumin / creatinine urine ratio   COMPLETE METABOLIC PANEL WITHOUT GFR   Lipid panel   CBC   Urine Microscopic Only   Meds ordered this encounter  Medications   SUMAtriptan  (IMITREX ) 25 MG tablet    Sig: Take 1 tablet (25 mg total) by mouth every 2 (two) hours as needed for migraine. May repeat in 2 hours if headache persists or recurs.    Dispense:  15 tablet    Refill:  1   Semaglutide ,0.25 or 0.5MG /DOS, 2 MG/3ML SOPN    Sig: Inject 0.25 mg into the skin once a week.    Dispense:  3 mL    Refill:  2      Immunization History  Administered Date(s) Administered   INFLUENZA, HIGH DOSE SEASONAL PF 01/25/2023, 11/02/2023   Janssen (J&J) SARS-COV-2 Vaccination 04/08/2019   Moderna Covid-19 Fall Seasonal Vaccine 7yrs & older 01/25/2023   PNEUMOCOCCAL CONJUGATE-20 05/03/2023   Pfizer(Comirnaty)Fall Seasonal Vaccine 12 years and older 04/10/2022, 01/25/2023   Pneumococcal Conjugate-13 11/22/2020   Zoster Recombinant(Shingrix) 02/17/2021, 04/27/2021    Diabetes Related Lab Review: Lab Results  Component Value Date   HGBA1C 6.8 (A) 11/02/2023   HGBA1C 7.2 (A) 08/02/2023   HGBA1C 7.0 (A) 05/03/2023    Lab Results  Component Value Date   MICROALBUR <0.7 05/03/2023   Lab Results  Component Value Date   CREATININE 1.03 (H) 11/16/2023   BUN 17 11/16/2023   NA 138 11/16/2023   K 3.6 11/16/2023   CL 98 11/16/2023   CO2 24 11/16/2023   Lab Results  Component Value Date   CHOL 145 05/03/2023   CHOL 165 12/27/2021   Lab Results  Component Value Date   HDL 50.80 05/03/2023   HDL 56.20 12/27/2021   Lab Results  Component Value Date   LDLCALC 60 05/03/2023   Lab Results  Component Value Date   TRIG  169.0 (H) 05/03/2023   TRIG 213.0 (H) 12/27/2021   Lab Results  Component Value Date   CHOLHDL 3 05/03/2023   CHOLHDL 3 12/27/2021   Lab Results  Component Value Date   LDLDIRECT 78.0 12/27/2021   The 10-year ASCVD risk score (Arnett DK, et al., 2019) is: 12.5%   Values used to calculate the score:     Age: 57 years     Clinically relevant sex: Female     Is Non-Hispanic African American: No     Diabetic: Yes     Tobacco smoker: No     Systolic Blood Pressure: 118 mmHg     Is BP treated: No     HDL Cholesterol: 50.8 mg/dL     Total Cholesterol: 145 mg/dL I have reviewed the PMH, Fam and Soc history. Patient Active Problem List   Diagnosis Date Noted Date Diagnosed   Obesity (BMI 30-39.9) 05/03/2023     Priority: High   Uncontrolled type 2 diabetes mellitus with hyperglycemia (HCC) 09/29/2021  Priority: High     intolerance with Metformin , trial of metformin  xr 04/2023; jardiance  and glucotrol  xl. Defers GLP-1, pt preference    Hyperlipidemia associated with type 2 diabetes mellitus (HCC) 09/29/2021     Priority: High    Lipitor 20    Moderate recurrent major depression (HCC) 09/29/2021     Priority: High   Migraine 02/13/2024     Priority: Medium     Rare, mild. Treated with intermittent imitrex , 2-3/year. No red flags.    Osteopenia after menopause 12/30/2023     Priority: Medium     Dexa 12/2023: Lowest T = -1.3 fem neck: FRAX 14 and 2; osteopenia; recheck 2 years. Routing guidance.    Overactive bladder 05/03/2023     Priority: Medium    History of peptic ulcer 05/03/2023     Priority: Medium     History of nsaid induced ulcers; EGD. Neg H.pylori, neg HH    GERD (gastroesophageal reflux disease) 09/29/2021     Priority: Medium     Prilosec chronic, high dose per GI, [place]  History of nsaid induced ulcers; EGD. Neg H.pylori, neg HH    Microscopic hematuria 05/14/2023     April 2025; needs repeat and repeat culture. To urology if persists     Social  History: Patient  reports that she has never smoked. She has been exposed to tobacco smoke. She has never used smokeless tobacco. She reports that she does not drink alcohol and does not use drugs.  Review of Systems: Ophthalmic: negative for eye pain, loss of vision or double vision Cardiovascular: negative for chest pain Respiratory: negative for SOB or persistent cough Gastrointestinal: negative for abdominal pain Genitourinary: negative for dysuria or gross hematuria MSK: negative for foot lesions Neurologic: negative for weakness or gait disturbance  Objective  Vitals: BP 118/82   Pulse 70   Temp 98 F (36.7 C) (Temporal)   Resp 12   Ht 5' 4 (1.626 m)   Wt 182 lb 12.8 oz (82.9 kg)   LMP  (LMP Unknown)   SpO2 98%   BMI 31.38 kg/m  General: well appearing, no acute distress  Psych:  Alert and oriented, normal mood and affect HEENT:  Normocephalic, atraumatic, moist mucous membranes, supple neck  Cardiovascular:  Nl S1 and S2, RRR without murmur, gallop or rub. no edema Respiratory: Clear to auscultation bilaterally Diabetic education: ongoing education regarding chronic disease management for diabetes was given today. We continue to reinforce the ABC's of diabetic management: A1c (<7 or 8 dependent upon patient), tight blood pressure control, and cholesterol management with goal LDL < 100 minimally. We discuss diet strategies, exercise recommendations, medication options and possible side effects. At each visit, we review recommended immunizations and preventive care recommendations for diabetics and stress that good diabetic control can prevent other problems. See below for this patient's data. Commons side effects, risks, benefits, and alternatives for medications and treatment plan prescribed today were discussed, and the patient expressed understanding of the given instructions. Patient is instructed to call or message via MyChart if he/she has any questions or concerns  regarding our treatment plan. No barriers to understanding were identified. We discussed Red Flag symptoms and signs in detail. Patient expressed understanding regarding what to do in case of urgent or emergency type symptoms.  Medication list was reconciled, printed and provided to the patient in AVS. Patient instructions and summary information was reviewed with the patient as documented in the AVS. This note was prepared with assistance of  Dragon chemical engineer. Occasional wrong-word or sound-a-like substitutions may have occurred due to the inherent limitations of voice recognition software   "

## 2024-02-14 ENCOUNTER — Other Ambulatory Visit (HOSPITAL_COMMUNITY): Payer: Self-pay

## 2024-02-14 ENCOUNTER — Telehealth: Payer: Self-pay

## 2024-02-14 NOTE — Telephone Encounter (Signed)
 Pharmacy Patient Advocate Encounter   Received notification from [facility] KEY that prior authorization for Ozempic  (0.25 or 0.5 MG/DOSE) 2MG /3ML pen-injectors is required/requested.   Insurance verification completed.   The patient is insured through [facility] ADVANTAGE/RX ADVANCE.   Per test claim: PA required; PA submitted to above mentioned insurance via Latent Key/confirmation #/[facility] Status is pending

## 2024-02-14 NOTE — Telephone Encounter (Signed)
 Pharmacy Patient Advocate Encounter  Received notification from [facility] ADVANTAGE/RX ADVANCE that Prior Authorization for Ozempic  (0.25 or 0.5 MG/DOSE) 2MG /3ML pen-injectors  has been APPROVED from 02/14/24 to 02/13/25. Unable to obtain price due to refill too soon rejection, last fill date 02/14/24 next available fill date02/12/26   PA #/Case ID/Reference #: [id number]

## 2024-02-15 NOTE — Telephone Encounter (Signed)
 Noted. Alerted patient of approval.

## 2024-02-18 ENCOUNTER — Ambulatory Visit: Payer: Self-pay | Admitting: Family Medicine

## 2024-02-18 NOTE — Progress Notes (Signed)
 See mychart note Dear Ms. Vicci, Your lab results look good. Diabetic control is now very good as is your cholesterol control. Your urine testing is all fine.  No changes are needed; let's see if you tolerate the ozempic .   Sincerely, Dr. Jodie

## 2024-02-27 ENCOUNTER — Encounter: Payer: Self-pay | Admitting: Family Medicine

## 2024-05-13 ENCOUNTER — Ambulatory Visit: Admitting: Family Medicine

## 2024-08-07 ENCOUNTER — Ambulatory Visit
